# Patient Record
Sex: Female | Born: 1995 | Race: White | Hispanic: No | Marital: Married | Smoking: Never smoker
Health system: Southern US, Community
[De-identification: ages and names within clinical notes are randomized; demographics above are authoritative.]

## PROBLEM LIST (undated history)

## (undated) ENCOUNTER — Inpatient Hospital Stay (HOSPITAL_COMMUNITY): Payer: Self-pay

## (undated) DIAGNOSIS — B9689 Other specified bacterial agents as the cause of diseases classified elsewhere: Secondary | ICD-10-CM

## (undated) DIAGNOSIS — N760 Acute vaginitis: Secondary | ICD-10-CM

## (undated) DIAGNOSIS — Z789 Other specified health status: Secondary | ICD-10-CM

## (undated) HISTORY — PX: OTHER SURGICAL HISTORY: SHX169

## (undated) HISTORY — PX: NO PAST SURGERIES: SHX2092

## (undated) HISTORY — PX: WISDOM TOOTH EXTRACTION: SHX21

---

## 2017-01-07 ENCOUNTER — Inpatient Hospital Stay (HOSPITAL_COMMUNITY)
Admission: AD | Admit: 2017-01-07 | Discharge: 2017-01-07 | Disposition: A | Discharge status: Home / Self Care | Payer: 59 | Source: Ambulatory Visit | Attending: Obstetrics and Gynecology | Admitting: Obstetrics and Gynecology

## 2017-01-07 ENCOUNTER — Encounter (HOSPITAL_COMMUNITY): Payer: Self-pay | Admitting: *Deleted

## 2017-01-07 DIAGNOSIS — K2970 Gastritis, unspecified, without bleeding: Secondary | ICD-10-CM | POA: Insufficient documentation

## 2017-01-07 DIAGNOSIS — O218 Other vomiting complicating pregnancy: Secondary | ICD-10-CM | POA: Insufficient documentation

## 2017-01-07 DIAGNOSIS — Z3A01 Less than 8 weeks gestation of pregnancy: Secondary | ICD-10-CM | POA: Insufficient documentation

## 2017-01-07 DIAGNOSIS — Z3201 Encounter for pregnancy test, result positive: Secondary | ICD-10-CM | POA: Insufficient documentation

## 2017-01-07 DIAGNOSIS — R112 Nausea with vomiting, unspecified: Secondary | ICD-10-CM | POA: Diagnosis present

## 2017-01-07 LAB — URINALYSIS, ROUTINE W REFLEX MICROSCOPIC
Bilirubin Urine: NEGATIVE
GLUCOSE, UA: NEGATIVE mg/dL
Hgb urine dipstick: NEGATIVE
KETONES UR: 80 mg/dL — AB
LEUKOCYTES UA: NEGATIVE
NITRITE: NEGATIVE
PH: 5 (ref 5.0–8.0)
Protein, ur: NEGATIVE mg/dL
SPECIFIC GRAVITY, URINE: 1.029 (ref 1.005–1.030)

## 2017-01-07 LAB — POCT PREGNANCY, URINE: Preg Test, Ur: POSITIVE — AB

## 2017-01-07 MED ORDER — PROMETHAZINE HCL 25 MG/ML IJ SOLN
25.0000 mg | Freq: Once | INTRAMUSCULAR | Status: AC
  Administered 2017-01-07: 25 mg via INTRAVENOUS
  Filled 2017-01-07: qty 1

## 2017-01-07 MED ORDER — M.V.I. ADULT IV INJ
INJECTION | Freq: Once | INTRAVENOUS | Status: DC
  Filled 2017-01-07: qty 1000

## 2017-01-07 MED ORDER — PROMETHAZINE HCL 12.5 MG PO TABS: 12.5000 mg | ORAL_TABLET | Freq: Four times a day (QID) | ORAL | 0 refills | Status: DC | PRN

## 2017-01-07 MED ORDER — PROMETHAZINE HCL 12.5 MG RE SUPP: 12.5000 mg | Freq: Four times a day (QID) | RECTAL | 0 refills | Status: DC | PRN

## 2017-01-07 NOTE — MAU Note (Signed)
Pt presents to MAU with complaints of nausea and vomiting since Friday night. Denies any VB or abnormal discharge. Reports lower abdominal cramping that started this morning. Unable to keep anything down for 2 days

## 2017-01-07 NOTE — MAU Provider Note (Signed)
History     CSN: [id number]  Arrival date and time: 01/07/17 1249   First Provider Initiated Contact with Patient 01/07/17 1349      Chief Complaint  Patient presents with  . Emesis During Pregnancy   HPI  Ms. Melody Mcclain is a 21 yo G1P0 at 6.[redacted] wks gestation presenting to MAU with complaints of abdominal cramping and N/V since Friday.  She reports not being able to keep anything down since Friday.  She denies VB or abnl vaginal d/c.   History reviewed. No pertinent past medical history.  History reviewed. No pertinent surgical history.  No family history on file.  Social History  Substance Use Topics  . Smoking status: Never Smoker  . Smokeless tobacco: Never Used  . Alcohol use No    Allergies: No Known Allergies  Prescriptions Prior to Admission  Medication Sig Dispense Refill Last Dose  . acetaminophen (TYLENOL) 500 MG tablet Take 500 mg by mouth every 6 (six) hours as needed for mild pain.   Past Week at Unknown time  . ondansetron (ZOFRAN) 4 MG tablet Take 4 mg by mouth every 8 (eight) hours as needed for nausea or vomiting.   Past Week at Unknown time  . Prenatal Vit-Fe Fumarate-FA (PRENATAL MULTIVITAMIN) TABS tablet Take 1 tablet by mouth daily at 12 noon.   Past Week at Unknown time    Review of Systems  Constitutional: Positive for appetite change and fatigue.  HENT: Negative.   Eyes: Negative.   Respiratory: Negative.   Cardiovascular: Negative.   Gastrointestinal: Positive for abdominal pain, nausea and vomiting. Negative for diarrhea.  Genitourinary: Negative.   Musculoskeletal: Negative.   Skin: Negative.   Allergic/Immunologic: Negative.   Neurological: Negative.   Hematological: Negative.   Psychiatric/Behavioral: Negative.    Physical Exam   Blood pressure 125/75, pulse (!) 107, temperature 98.1 F (36.7 C), resp. rate 18, height 5\' 5"  (1.651 m), weight 56.7 kg (125 lb), last menstrual period 11/21/2016.  Physical Exam  Constitutional:  She is oriented to person, place, and time. She appears well-developed and well-nourished.  HENT:  Head: Normocephalic.  Eyes: Pupils are equal, round, and reactive to light.  Neck: Normal range of motion.  Cardiovascular: Normal rate, regular rhythm and normal heart sounds.   Respiratory: Effort normal and breath sounds normal.  GI: Soft. Bowel sounds are normal.  Musculoskeletal: Normal range of motion.  Neurological: She is alert and oriented to person, place, and time.  Skin: Skin is warm and dry.  Psychiatric: She has a normal mood and affect. Her behavior is normal. Judgment and thought content normal.    MAU Course  Procedures  MDM CCUA UPT C. Difficile PCR D5LR 1000 ml with Phenergan 25 mg bolus LR 1000 ml with 1 amp MVI @ 500 ml/hr  TC to Dr. Henderson CloudHorvath @ 1640 -- no answer, left message to return call *Consult with Dr. Henderson CloudHorvath @ [phone] - notified of patient's complaints, assessments, lab & U/S results, tx plan d/c home with Rx for Phenergan, call MD and pt with C. Diff results - ok to d/c home, agrees with plan   Results for orders placed or performed during the hospital encounter of 01/07/17 (from the past 24 hour(s))  Urinalysis, Routine w reflex microscopic     Status: Abnormal   Collection Time: 01/07/17 12:50 PM  Result Value Ref Range   Color, Urine YELLOW YELLOW   APPearance HAZY (A) CLEAR   Specific Gravity, Urine 1.029 1.005 - 1.030  pH 5.0 5.0 - 8.0   Glucose, UA NEGATIVE NEGATIVE mg/dL   Hgb urine dipstick NEGATIVE NEGATIVE   Bilirubin Urine NEGATIVE NEGATIVE   Ketones, ur 80 (A) NEGATIVE mg/dL   Protein, ur NEGATIVE NEGATIVE mg/dL   Nitrite NEGATIVE NEGATIVE   Leukocytes, UA NEGATIVE NEGATIVE  Pregnancy, urine POC     Status: Abnormal   Collection Time: 01/07/17  1:03 PM  Result Value Ref Range   Preg Test, Ur POSITIVE (A) NEGATIVE    Assessment and Plan  Gastritis without bleeding, unspecified chronicity, unspecified gastritis type - Rx Phenergan  12.5 mg po every 6 hrs - Bland diet instructions given -- advance as tolerated - Gastritis instructions given - Keep appt scheduled on 01/14/2017  Discharge home Patient verbalized an understanding of the plan of care and agrees.   Raelyn Mora, MSN, CNM 01/07/2017, 1:52 PM

## 2017-01-08 ENCOUNTER — Telehealth: Payer: Self-pay | Admitting: Obstetrics and Gynecology

## 2017-01-08 ENCOUNTER — Encounter: Payer: Self-pay | Admitting: Obstetrics and Gynecology

## 2017-01-08 LAB — C DIFFICILE QUICK SCREEN W PCR REFLEX
C DIFFICILE (CDIFF) INTERP: NOT DETECTED
C DIFFICILE (CDIFF) TOXIN: NEGATIVE
C DIFFICLE (CDIFF) ANTIGEN: NEGATIVE

## 2017-01-08 NOTE — Telephone Encounter (Signed)
TC to discuss results of C. Difficile test.  Notified that results are negative. According to the advisement of Dr. Henderson CloudHorvath, she can take Immodium AD as directed on the bottle.  Recommend continuing B.R.A.T.diet as tolerated and advance diet slowly.  Recommend keeping hard surfaces cleaned off with bleach and practice good hand washing. Offered letter for work, but patient declined.  All questions and concerns addressed.  Patient verbalized an understanding of the plan of care and agrees.   Raelyn Moraolitta Chanae Gemma, CNM 01/08/2017, 8:14 AM

## 2017-02-05 LAB — OB RESULTS CONSOLE ABO/RH: RH TYPE: POSITIVE

## 2017-02-05 LAB — OB RESULTS CONSOLE HIV ANTIBODY (ROUTINE TESTING): HIV: NONREACTIVE

## 2017-02-05 LAB — OB RESULTS CONSOLE GC/CHLAMYDIA
Chlamydia: NEGATIVE
Gonorrhea: NEGATIVE

## 2017-02-05 LAB — OB RESULTS CONSOLE HEPATITIS B SURFACE ANTIGEN: HEP B S AG: NEGATIVE

## 2017-02-05 LAB — OB RESULTS CONSOLE RUBELLA ANTIBODY, IGM: Rubella: IMMUNE

## 2017-02-05 LAB — OB RESULTS CONSOLE RPR: RPR: NONREACTIVE

## 2017-02-05 LAB — OB RESULTS CONSOLE ANTIBODY SCREEN: ANTIBODY SCREEN: NEGATIVE

## 2017-05-12 ENCOUNTER — Inpatient Hospital Stay (HOSPITAL_COMMUNITY)
Admission: AD | Admit: 2017-05-12 | Discharge: 2017-05-12 | Disposition: A | Discharge status: Home / Self Care | Payer: 59 | Source: Ambulatory Visit | Attending: Obstetrics and Gynecology | Admitting: Obstetrics and Gynecology

## 2017-05-12 ENCOUNTER — Encounter (HOSPITAL_COMMUNITY): Payer: Self-pay

## 2017-05-12 DIAGNOSIS — B9689 Other specified bacterial agents as the cause of diseases classified elsewhere: Secondary | ICD-10-CM

## 2017-05-12 DIAGNOSIS — Z3A24 24 weeks gestation of pregnancy: Secondary | ICD-10-CM | POA: Diagnosis not present

## 2017-05-12 DIAGNOSIS — O23592 Infection of other part of genital tract in pregnancy, second trimester: Secondary | ICD-10-CM | POA: Insufficient documentation

## 2017-05-12 DIAGNOSIS — O9989 Other specified diseases and conditions complicating pregnancy, childbirth and the puerperium: Secondary | ICD-10-CM

## 2017-05-12 DIAGNOSIS — N898 Other specified noninflammatory disorders of vagina: Secondary | ICD-10-CM

## 2017-05-12 DIAGNOSIS — O26892 Other specified pregnancy related conditions, second trimester: Secondary | ICD-10-CM | POA: Diagnosis present

## 2017-05-12 DIAGNOSIS — N760 Acute vaginitis: Secondary | ICD-10-CM | POA: Diagnosis not present

## 2017-05-12 HISTORY — DX: Acute vaginitis: N76.0

## 2017-05-12 HISTORY — DX: Other specified bacterial agents as the cause of diseases classified elsewhere: B96.89

## 2017-05-12 LAB — POCT FERN TEST: POCT FERN TEST: NEGATIVE

## 2017-05-12 LAB — WET PREP, GENITAL
Trich, Wet Prep: NONE SEEN
Yeast Wet Prep HPF POC: NONE SEEN

## 2017-05-12 LAB — URINALYSIS, ROUTINE W REFLEX MICROSCOPIC
Bilirubin Urine: NEGATIVE
GLUCOSE, UA: NEGATIVE mg/dL
Hgb urine dipstick: NEGATIVE
KETONES UR: 5 mg/dL — AB
LEUKOCYTES UA: NEGATIVE
Nitrite: NEGATIVE
PH: 6 (ref 5.0–8.0)
Protein, ur: NEGATIVE mg/dL
Specific Gravity, Urine: 1.012 (ref 1.005–1.030)

## 2017-05-12 MED ORDER — METRONIDAZOLE 500 MG PO TABS: 500.0000 mg | ORAL_TABLET | Freq: Two times a day (BID) | ORAL | 0 refills | Status: AC

## 2017-05-12 NOTE — MAU Note (Signed)
Pt has been leaking fluid for a week. Called the office today and was told to come in. Small amount of fluid, no bleeding. + FM

## 2017-05-12 NOTE — MAU Provider Note (Signed)
History     CSN: [id number]  Arrival date and time: 05/12/17 1747   First Provider Initiated Contact with Patient 05/12/17 1826      Chief Complaint  Patient presents with  . Rupture of Membranes   HPI  Melody Mcclain is a 21 y.o. G1P0 at 6441w4d gestation presenting to MAU with complaints of "leaking fluid" x 1 wk. She talked to a friend who had premature rupture of membranes and "something was wrong with the baby", so she is "worried something could be wrong". She has been wearing pantiliners for a week and they have been wet at the end of the day, but was "more wet" this morning.    Past Medical History:  Diagnosis Date  . BV (bacterial vaginosis)     Past Surgical History:  Procedure Laterality Date  . WISDOM TOOTH EXTRACTION      History reviewed. No pertinent family history.  Social History   Tobacco Use  . Smoking status: Never Smoker  . Smokeless tobacco: Never Used  Substance Use Topics  . Alcohol use: No  . Drug use: No    Allergies: No Known Allergies  Medications Prior to Admission  Medication Sig Dispense Refill Last Dose  . acetaminophen (TYLENOL) 500 MG tablet Take 500 mg by mouth every 6 (six) hours as needed for mild pain.   Past Week at Unknown time  . ondansetron (ZOFRAN) 4 MG tablet Take 4 mg by mouth every 8 (eight) hours as needed for nausea or vomiting.   Past Week at Unknown time  . Prenatal Vit-Fe Fumarate-FA (PRENATAL MULTIVITAMIN) TABS tablet Take 1 tablet by mouth daily at 12 noon.   Past Week at Unknown time  . promethazine (PHENERGAN) 12.5 MG tablet Take 1 tablet (12.5 mg total) by mouth every 6 (six) hours as needed for nausea or vomiting. 30 tablet 0     Review of Systems  Constitutional: Negative.   HENT: Negative.   Eyes: Negative.   Respiratory: Negative.   Cardiovascular: Negative.   Gastrointestinal: Negative.   Endocrine: Negative.   Genitourinary: Positive for vaginal discharge (unsure if leaking x 1 week).   Musculoskeletal: Negative.   Skin: Negative.   Allergic/Immunologic: Negative.   Neurological: Negative.   Hematological: Negative.   Psychiatric/Behavioral: Negative.    Physical Exam   Temperature 98.4 F (36.9 C), temperature source Oral, resp. rate 16, last menstrual period 11/21/2016, SpO2 99 %.  Physical Exam  Nursing note and vitals reviewed. Constitutional: She is oriented to person, place, and time. She appears well-nourished.  HENT:  Head: Normocephalic.  Eyes: Pupils are equal, round, and reactive to light.  Neck: Normal range of motion.  Cardiovascular: Normal rate, regular rhythm and normal heart sounds.  Respiratory: Effort normal and breath sounds normal.  GI: Soft. Bowel sounds are normal.  Genitourinary:  Genitourinary Comments: Uterus: gravid, S=D, cx: smooth, pink, no lesions, moderate amt of clear, watery, mucoid d/c coming from cervical os, closed/long/firm, no CMT or friability, no adnexal tenderness   Musculoskeletal: Normal range of motion.  Neurological: She is alert and oriented to person, place, and time.  Skin: Skin is warm and dry.  Psychiatric: She has a normal mood and affect. Her behavior is normal. Judgment and thought content normal.    MAU Course  Procedures  MDM CCUA Wet Prep Fern NST - FHR: 130 bpm / moderate variability / accels present / decels absent / TOCO: none  *Consult with Dr. Claiborne Billingsallahan @ 2000 - notified of patient's  complaints, assessments, lab & U/S results, tx plan Rx for Flagyl and keep scheduled appt - ok to d/c home, agrees with plan  Results for orders placed or performed during the hospital encounter of 05/12/17 (from the past 24 hour(s))  Wet prep, genital     Status: Abnormal   Collection Time: 05/12/17  6:30 PM  Result Value Ref Range   Yeast Wet Prep HPF POC NONE SEEN NONE SEEN   Trich, Wet Prep NONE SEEN NONE SEEN   Clue Cells Wet Prep HPF POC PRESENT (A) NONE SEEN   WBC, Wet Prep HPF POC MODERATE (A) NONE SEEN    Sperm PRESENT   Fern Test     Status: None   Collection Time: 05/12/17  6:53 PM  Result Value Ref Range   POCT Fern Test Negative = intact amniotic membranes   Urinalysis, Routine w reflex microscopic     Status: Abnormal   Collection Time: 05/12/17  6:58 PM  Result Value Ref Range   Color, Urine YELLOW YELLOW   APPearance CLEAR CLEAR   Specific Gravity, Urine 1.012 1.005 - 1.030   pH 6.0 5.0 - 8.0   Glucose, UA NEGATIVE NEGATIVE mg/dL   Hgb urine dipstick NEGATIVE NEGATIVE   Bilirubin Urine NEGATIVE NEGATIVE   Ketones, ur 5 (A) NEGATIVE mg/dL   Protein, ur NEGATIVE NEGATIVE mg/dL   Nitrite NEGATIVE NEGATIVE   Leukocytes, UA NEGATIVE NEGATIVE    Assessment and Plan  Bacterial vaginitis - Rx for Flagyl 500 mg po BID x 7 days sent - Information provided on BV and Flagyl Discharge home Keep scheduled appt with [facility] on 06/02/2017 Patient verbalized an understanding of the plan of care and agrees.    Melody Moraolitta Jerika Wales, MSN, CNM 05/12/2017, 6:38 PM

## 2017-05-16 ENCOUNTER — Encounter (HOSPITAL_COMMUNITY): Payer: Self-pay

## 2017-05-16 ENCOUNTER — Inpatient Hospital Stay (HOSPITAL_COMMUNITY)
Admission: AD | Admit: 2017-05-16 | Discharge: 2017-05-16 | Disposition: A | Discharge status: Home / Self Care | Payer: 59 | Source: Ambulatory Visit | Attending: Obstetrics | Admitting: Obstetrics

## 2017-05-16 DIAGNOSIS — R102 Pelvic and perineal pain: Secondary | ICD-10-CM | POA: Insufficient documentation

## 2017-05-16 DIAGNOSIS — Z3A25 25 weeks gestation of pregnancy: Secondary | ICD-10-CM | POA: Insufficient documentation

## 2017-05-16 DIAGNOSIS — R109 Unspecified abdominal pain: Secondary | ICD-10-CM | POA: Insufficient documentation

## 2017-05-16 DIAGNOSIS — O26892 Other specified pregnancy related conditions, second trimester: Secondary | ICD-10-CM | POA: Diagnosis not present

## 2017-05-16 LAB — URINALYSIS, ROUTINE W REFLEX MICROSCOPIC
BILIRUBIN URINE: NEGATIVE
Glucose, UA: NEGATIVE mg/dL
HGB URINE DIPSTICK: NEGATIVE
KETONES UR: NEGATIVE mg/dL
NITRITE: NEGATIVE
PH: 7 (ref 5.0–8.0)
Protein, ur: NEGATIVE mg/dL
SPECIFIC GRAVITY, URINE: 1.015 (ref 1.005–1.030)
Squamous Epithelial / LPF: NONE SEEN

## 2017-05-16 NOTE — Discharge Instructions (Signed)

## 2017-05-16 NOTE — MAU Provider Note (Signed)
Chief Complaint:  Abdominal Pain and Pelvic Pain   First Provider Initiated Contact with Patient 05/16/17 1643     HPI  HPI: Melody Mcclain is a 21 y.o. G1P0 at 1125w1dwho presents to maternity admissions reporting pelvic pain & vaginal bleeding. Reports intermittent sharp pains in vagina & constant pelvic pressure that started last Wednesday when she was seen in MAU. Denies n/v/d, dysuria, vaginal bleeding, vaginal discharge, or recent intercourse. Rates pain 4/10. Has not treated. Positive fetal movement.  Past Medical History: Past Medical History:  Diagnosis Date  . BV (bacterial vaginosis)     Past obstetric history: OB History  Gravida Para Term Preterm AB Living  1            SAB TAB Ectopic Multiple Live Births               # Outcome Date GA Lbr Len/2nd Weight Sex Delivery Anes PTL Lv  1 Current               Past Surgical History: Past Surgical History:  Procedure Laterality Date  . WISDOM TOOTH EXTRACTION      Family History: History reviewed. No pertinent family history.  Social History: Social History   Tobacco Use  . Smoking status: Never Smoker  . Smokeless tobacco: Never Used  Substance Use Topics  . Alcohol use: No  . Drug use: No    Allergies: No Known Allergies  Meds:  No medications prior to admission.    I have reviewed patient's Past Medical Hx, Surgical Hx, Family Hx, Social Hx, medications and allergies.   ROS:  Review of Systems  Gastrointestinal: Positive for abdominal pain. Negative for constipation, diarrhea, nausea and vomiting.  Genitourinary: Positive for pelvic pain. Negative for vaginal bleeding and vaginal discharge.   Other systems negative  Physical Exam   Patient Vitals for the past 24 hrs:  BP Temp Temp src Pulse Resp SpO2 Height Weight  05/16/17 1740 96/61 - - - - - - -  05/16/17 1619 122/67 98.2 F (36.8 C) Oral (!) 101 18 99 % 5\' 4"  (1.626 m) 135 lb (61.2 kg)   Constitutional: Well-developed, well-nourished  female in no acute distress.  Cardiovascular: normal rate and rhythm Respiratory: normal effort, clear to auscultation bilaterally GI: Abd soft, non-tender, gravid appropriate for gestational age.   No rebound or guarding. MS: Extremities nontender, no edema, normal ROM Neurologic: Alert and oriented x 4.  GU: Neg CVAT.  Dilation: Closed Effacement (%): Thick Cervical Position: Posterior Exam by:: e Amyriah Buras np Dilation: Closed Effacement (%): Thick Cervical Position: Posterior Exam by:: e Kumari Sculley np  FHT:  Baseline 150 , moderate variability, accelerations present, no decelerations Contractions: none   Labs: Results for orders placed or performed during the hospital encounter of 05/16/17 (from the past 24 hour(s))  Urinalysis, Routine w reflex microscopic     Status: Abnormal   Collection Time: 05/16/17  4:20 PM  Result Value Ref Range   Color, Urine YELLOW YELLOW   APPearance CLOUDY (A) CLEAR   Specific Gravity, Urine 1.015 1.005 - 1.030   pH 7.0 5.0 - 8.0   Glucose, UA NEGATIVE NEGATIVE mg/dL   Hgb urine dipstick NEGATIVE NEGATIVE   Bilirubin Urine NEGATIVE NEGATIVE   Ketones, ur NEGATIVE NEGATIVE mg/dL   Protein, ur NEGATIVE NEGATIVE mg/dL   Nitrite NEGATIVE NEGATIVE   Leukocytes, UA TRACE (A) NEGATIVE   RBC / HPF 0-5 0 - 5 RBC/hpf   WBC, UA 0-5 0 - 5  WBC/hpf   Bacteria, UA RARE (A) NONE SEEN   Squamous Epithelial / LPF NONE SEEN NONE SEEN      Imaging:  No results found.  MAU Course/MDM: Fetal tracing appropriate for gestation No ctx on toco or palpated Cervix closed/thick/firm/posterior Trace leuks on u/a; asymptomatic -- will send for urine culture Patient reassured by exam.  C/w Dr. Chestine Sporelark. Ok to discharge home  Assessment: 1. Abdominal pain during pregnancy in second trimester   2. [redacted] weeks gestation of pregnancy     Plan: Discharge home Preterm Labor precautions Follow up in Office for prenatal visits and recheck Follow-up Information     [facility]   Contact information: [address] [place] [id number] [phone]           Pt stable at time of discharge.  Melody HornErin Britney Newstrom, FNP 05/16/2017 9:00 PM

## 2017-05-16 NOTE — MAU Note (Signed)
Pt reports vaginal pressure and sharp pain. Reports she is on meds for BV.

## 2017-05-18 LAB — CULTURE, OB URINE: CULTURE: NO GROWTH

## 2017-06-01 NOTE — L&D Delivery Note (Addendum)
Pt arrived to MAU for labor eval and had only lip of cervix present.  I was called to MAU and advised that no bed was available on L&D and no staffing available to assist with delivery.  Patient was C/C/+1 and pushed for approx 20 minutes without epidural.    NSVD  maleinfant, Apgars 9/9, weight pending.   The patient had tiny first degree perineal laceration repaired with 3-0 vicryl in figure of eight and bilateral labial tears repaired with 3-0 vicryl. Fundus was firm. EBL was expected amount. Placenta was delivered intact. Vagina was clear.  Delayed cord clamping done for 30-60 seconds while warming baby. Baby was vigorous and doing skin to skin with mother.  Melody Mcclain, Melody Mcclain

## 2017-07-30 LAB — OB RESULTS CONSOLE GBS: STREP GROUP B AG: NEGATIVE

## 2017-08-08 ENCOUNTER — Inpatient Hospital Stay (HOSPITAL_COMMUNITY)
Admission: AD | Admit: 2017-08-08 | Discharge: 2017-08-08 | Disposition: A | Discharge status: Home / Self Care | Payer: 59 | Source: Ambulatory Visit | Attending: Obstetrics and Gynecology | Admitting: Obstetrics and Gynecology

## 2017-08-08 ENCOUNTER — Encounter (HOSPITAL_COMMUNITY): Payer: Self-pay

## 2017-08-08 ENCOUNTER — Other Ambulatory Visit: Payer: Self-pay

## 2017-08-08 DIAGNOSIS — O4703 False labor before 37 completed weeks of gestation, third trimester: Secondary | ICD-10-CM

## 2017-08-08 DIAGNOSIS — Z3483 Encounter for supervision of other normal pregnancy, third trimester: Secondary | ICD-10-CM | POA: Diagnosis present

## 2017-08-08 DIAGNOSIS — Z3A37 37 weeks gestation of pregnancy: Secondary | ICD-10-CM | POA: Diagnosis not present

## 2017-08-08 DIAGNOSIS — O26893 Other specified pregnancy related conditions, third trimester: Secondary | ICD-10-CM

## 2017-08-08 DIAGNOSIS — N898 Other specified noninflammatory disorders of vagina: Secondary | ICD-10-CM

## 2017-08-08 DIAGNOSIS — O479 False labor, unspecified: Secondary | ICD-10-CM

## 2017-08-08 LAB — POCT FERN TEST: POCT FERN TEST: NEGATIVE

## 2017-08-08 LAB — AMNISURE RUPTURE OF MEMBRANE (ROM) NOT AT ARMC: AMNISURE: NEGATIVE

## 2017-08-08 NOTE — Discharge Instructions (Signed)
Braxton Hicks Contractions °Contractions of the uterus can occur throughout pregnancy, but they are not always a sign that you are in labor. You may have practice contractions called Braxton Hicks contractions. These false labor contractions are sometimes confused with true labor. °What are Braxton Hicks contractions? °Braxton Hicks contractions are tightening movements that occur in the muscles of the uterus before labor. Unlike true labor contractions, these contractions do not result in opening (dilation) and thinning of the cervix. Toward the end of pregnancy (32-34 weeks), Braxton Hicks contractions can happen more often and may become stronger. These contractions are sometimes difficult to tell apart from true labor because they can be very uncomfortable. You should not feel embarrassed if you go to the hospital with false labor. °Sometimes, the only way to tell if you are in true labor is for your health care provider to look for changes in the cervix. The health care provider will do a physical exam and may monitor your contractions. If you are not in true labor, the exam should show that your cervix is not dilating and your water has not broken. °If there are other health problems associated with your pregnancy, it is completely safe for you to be sent home with false labor. You may continue to have Braxton Hicks contractions until you go into true labor. °How to tell the difference between true labor and false labor °True labor °· Contractions last 30-70 seconds. °· Contractions become very regular. °· Discomfort is usually felt in the top of the uterus, and it spreads to the lower abdomen and low back. °· Contractions do not go away with walking. °· Contractions usually become more intense and increase in frequency. °· The cervix dilates and gets thinner. °False labor °· Contractions are usually shorter and not as strong as true labor contractions. °· Contractions are usually irregular. °· Contractions  are often felt in the front of the lower abdomen and in the groin. °· Contractions may go away when you walk around or change positions while lying down. °· Contractions get weaker and are shorter-lasting as time goes on. °· The cervix usually does not dilate or become thin. °Follow these instructions at home: °· Take over-the-counter and prescription medicines only as told by your health care provider. °· Keep up with your usual exercises and follow other instructions from your health care provider. °· Eat and drink lightly if you think you are going into labor. °· If Braxton Hicks contractions are making you uncomfortable: °? Change your position from lying down or resting to walking, or change from walking to resting. °? Sit and rest in a tub of warm water. °? Drink enough fluid to keep your urine pale yellow. Dehydration may cause these contractions. °? Do slow and deep breathing several times an hour. °· Keep all follow-up prenatal visits as told by your health care provider. This is important. °Contact a health care provider if: °· You have a fever. °· You have continuous pain in your abdomen. °Get help right away if: °· Your contractions become stronger, more regular, and closer together. °· You have fluid leaking or gushing from your vagina. °· You pass blood-tinged mucus (bloody show). °· You have bleeding from your vagina. °· You have low back pain that you never had before. °· You feel your baby’s head pushing down and causing pelvic pressure. °· Your baby is not moving inside you as much as it used to. °Summary °· Contractions that occur before labor are called Braxton   Hicks contractions, false labor, or practice contractions. °· Braxton Hicks contractions are usually shorter, weaker, farther apart, and less regular than true labor contractions. True labor contractions usually become progressively stronger and regular and they become more frequent. °· Manage discomfort from Braxton Hicks contractions by  changing position, resting in a warm bath, drinking plenty of water, or practicing deep breathing. °This information is not intended to replace advice given to you by your health care provider. Make sure you discuss any questions you have with your health care provider. °Document Released: 10/01/2016 Document Revised: 10/01/2016 Document Reviewed: 10/01/2016 °Elsevier Interactive Patient Education © 2018 [employer] ° °Fetal Movement Counts °Patient Name: ________________________________________________ Patient Due Date: ____________________ °What is a fetal movement count? °A fetal movement count is the number of times that you feel your baby move during a certain amount of time. This may also be called a fetal kick count. A fetal movement count is recommended for every pregnant woman. You may be asked to start counting fetal movements as early as week 28 of your pregnancy. °Pay attention to when your baby is most active. You may notice your baby's sleep and wake cycles. You may also notice things that make your baby move more. You should do a fetal movement count: °· When your baby is normally most active. °· At the same time each day. ° °A good time to count movements is while you are resting, after having something to eat and drink. °How do I count fetal movements? °1. Find a quiet, comfortable area. Sit, or lie down on your side. °2. Write down the date, the start time and stop time, and the number of movements that you felt between those two times. Take this information with you to your health care visits. °3. For 2 hours, count kicks, flutters, swishes, rolls, and jabs. You should feel at least 10 movements during 2 hours. °4. You may stop counting after you have felt 10 movements. °5. If you do not feel 10 movements in 2 hours, have something to eat and drink. Then, keep resting and counting for 1 hour. If you feel at least 4 movements during that hour, you may stop counting. °Contact a health care  provider if: °· You feel fewer than 4 movements in 2 hours. °· Your baby is not moving like he or she usually does. °Date: ____________ Start time: ____________ Stop time: ____________ Movements: ____________ °Date: ____________ Start time: ____________ Stop time: ____________ Movements: ____________ °Date: ____________ Start time: ____________ Stop time: ____________ Movements: ____________ °Date: ____________ Start time: ____________ Stop time: ____________ Movements: ____________ °Date: ____________ Start time: ____________ Stop time: ____________ Movements: ____________ °Date: ____________ Start time: ____________ Stop time: ____________ Movements: ____________ °Date: ____________ Start time: ____________ Stop time: ____________ Movements: ____________ °Date: ____________ Start time: ____________ Stop time: ____________ Movements: ____________ °Date: ____________ Start time: ____________ Stop time: ____________ Movements: ____________ °This information is not intended to replace advice given to you by your health care provider. Make sure you discuss any questions you have with your health care provider. °Document Released: 06/17/2006 Document Revised: 01/15/2016 Document Reviewed: 06/27/2015 °Elsevier Interactive Patient Education © 2018 [employer] ° °

## 2017-08-08 NOTE — MAU Provider Note (Signed)
S: Ms. Melody Mcclain is a 22 y.o. G1P0 at 4864w1d  who presents to MAU today complaining of leaking of fluid one time this AM and then "brown floaters" in the toilet. She denies vaginal bleeding. She denies contractions. She reports normal fetal movement.    O: BP 113/70   Pulse 87   Resp 18   Ht 5\' 4"  (1.626 m)   Wt 153 lb (69.4 kg)   LMP 11/21/2016   SpO2 98%   BMI 26.26 kg/m  GENERAL: Well-developed, well-nourished female in no acute distress.  HEAD: Normocephalic, atraumatic.  CHEST: Normal effort of breathing, regular heart rate ABDOMEN: Soft, nontender, gravid PELVIC: Normal external female genitalia. Vagina is pink and rugated. Cervix with normal contour, no lesions. Normal discharge.  Small amt pooling of thin vaginal d/c.   Cervical exam:  Dilation: 1.5 Effacement (%): 40 Cervical Position: Posterior Station: Ballotable Presentation: Vertex Exam by:: Melody ArcherJ McClellan, RN  Fetal Monitoring: Baseline: 135 Variability: moderate Accelerations: present Decelerations: none Contractions: Occ UC's and UI noted  Results for orders placed or performed during the hospital encounter of 08/08/17 (from the past 24 hour(s))  Fern Test     Status: Normal   Collection Time: 08/08/17  5:19 PM  Result Value Ref Range   POCT Fern Test Negative = intact amniotic membranes   Am[facility]isure rupture of membrane (rom)[facility]     Status: None   Collection Time: 08/08/17  5:55 PM  Result Value Ref Range   Amnisure ROM NEGATIVE     A: SIUP at 2564w1d  Membranes intact  P: Report given to RN to contact MD on call for further instructions  Raelyn MoraRolitta Cynithia Hakimi, CNM 08/09/2017, 6:00 PM

## 2017-08-08 NOTE — Progress Notes (Addendum)
G1 @ 37.[redacted] wksga. Here dt possible ROM. Denies bleeding. EFM applied. +FM. VSS see flow sheet for details   SVE 1.5/thick/ballotable. No fluid noted on glove.   Fern collected. negative  1752: Provider at bs for speculum exam for pooling   amnisure done and negative result  1839: Dr. Henderson CloudHorvath notified. Report status of pt given. Orders received to have MAU provider put in D/C order.   D/c instructions given with pt understanding. Pt left unit via ambulatory with SO

## 2017-08-08 NOTE — MAU Note (Signed)
Urine sent to lab 

## 2017-08-24 ENCOUNTER — Encounter (HOSPITAL_COMMUNITY): Payer: Self-pay | Admitting: *Deleted

## 2017-08-24 ENCOUNTER — Other Ambulatory Visit: Payer: Self-pay | Admitting: Obstetrics and Gynecology

## 2017-08-24 ENCOUNTER — Telehealth (HOSPITAL_COMMUNITY): Payer: Self-pay | Admitting: *Deleted

## 2017-08-24 NOTE — Telephone Encounter (Signed)
Preadmission screen  

## 2017-08-25 ENCOUNTER — Inpatient Hospital Stay (HOSPITAL_COMMUNITY)
Admission: AD | Admit: 2017-08-25 | Discharge: 2017-08-26 | DRG: 807 | Disposition: A | Discharge status: Home / Self Care | Payer: 59 | Source: Ambulatory Visit | Attending: Obstetrics and Gynecology | Admitting: Obstetrics and Gynecology

## 2017-08-25 ENCOUNTER — Encounter (HOSPITAL_COMMUNITY): Payer: Self-pay

## 2017-08-25 ENCOUNTER — Inpatient Hospital Stay (HOSPITAL_COMMUNITY)
Admission: AD | Admit: 2017-08-25 | Discharge: 2017-08-25 | Disposition: A | Discharge status: Home / Self Care | Payer: 59 | Source: Ambulatory Visit | Attending: Obstetrics and Gynecology | Admitting: Obstetrics and Gynecology

## 2017-08-25 DIAGNOSIS — Z3483 Encounter for supervision of other normal pregnancy, third trimester: Secondary | ICD-10-CM | POA: Diagnosis present

## 2017-08-25 DIAGNOSIS — O479 False labor, unspecified: Secondary | ICD-10-CM

## 2017-08-25 DIAGNOSIS — Z3A39 39 weeks gestation of pregnancy: Secondary | ICD-10-CM

## 2017-08-25 HISTORY — DX: Other specified health status: Z78.9

## 2017-08-25 LAB — CBC
HCT: 35.6 % — ABNORMAL LOW (ref 36.0–46.0)
Hemoglobin: 12.1 g/dL (ref 12.0–15.0)
MCH: 30.1 pg (ref 26.0–34.0)
MCHC: 34 g/dL (ref 30.0–36.0)
MCV: 88.6 fL (ref 78.0–100.0)
PLATELETS: 177 10*3/uL (ref 150–400)
RBC: 4.02 MIL/uL (ref 3.87–5.11)
RDW: 13.5 % (ref 11.5–15.5)
WBC: 24.4 10*3/uL — AB (ref 4.0–10.5)

## 2017-08-25 LAB — ABO/RH: ABO/RH(D): A POS

## 2017-08-25 LAB — TYPE AND SCREEN
ABO/RH(D): A POS
Antibody Screen: NEGATIVE

## 2017-08-25 MED ORDER — FLEET ENEMA 7-19 GM/118ML RE ENEM: 1.0000 | ENEMA | RECTAL | Status: DC | PRN

## 2017-08-25 MED ORDER — SENNOSIDES-DOCUSATE SODIUM 8.6-50 MG PO TABS
2.0000 | ORAL_TABLET | ORAL | Status: DC
  Administered 2017-08-26: 2 via ORAL
  Filled 2017-08-25: qty 2

## 2017-08-25 MED ORDER — WITCH HAZEL-GLYCERIN EX PADS
1.0000 "application " | MEDICATED_PAD | CUTANEOUS | Status: DC | PRN
  Administered 2017-08-26: 1 via TOPICAL

## 2017-08-25 MED ORDER — TETANUS-DIPHTH-ACELL PERTUSSIS 5-2.5-18.5 LF-MCG/0.5 IM SUSP: 0.5000 mL | Freq: Once | INTRAMUSCULAR | Status: DC

## 2017-08-25 MED ORDER — ACETAMINOPHEN 325 MG PO TABS: 650.0000 mg | ORAL_TABLET | ORAL | Status: DC | PRN

## 2017-08-25 MED ORDER — OXYCODONE-ACETAMINOPHEN 5-325 MG PO TABS: 1.0000 | ORAL_TABLET | ORAL | Status: DC | PRN

## 2017-08-25 MED ORDER — LACTATED RINGERS IV SOLN
INTRAVENOUS | Status: DC
  Administered 2017-08-25: 10:00:00 via INTRAVENOUS

## 2017-08-25 MED ORDER — LACTATED RINGERS IV SOLN: 500.0000 mL | INTRAVENOUS | Status: DC | PRN

## 2017-08-25 MED ORDER — ZOLPIDEM TARTRATE 5 MG PO TABS: 5.0000 mg | ORAL_TABLET | Freq: Every evening | ORAL | Status: DC | PRN

## 2017-08-25 MED ORDER — OXYTOCIN BOLUS FROM INFUSION
500.0000 mL | Freq: Once | INTRAVENOUS | Status: AC
  Administered 2017-08-25: 500 mL via INTRAVENOUS

## 2017-08-25 MED ORDER — DIPHENHYDRAMINE HCL 25 MG PO CAPS: 25.0000 mg | ORAL_CAPSULE | Freq: Four times a day (QID) | ORAL | Status: DC | PRN

## 2017-08-25 MED ORDER — IBUPROFEN 600 MG PO TABS
600.0000 mg | ORAL_TABLET | Freq: Four times a day (QID) | ORAL | Status: DC
  Administered 2017-08-25 – 2017-08-26 (×4): 600 mg via ORAL
  Filled 2017-08-25 (×4): qty 1

## 2017-08-25 MED ORDER — SIMETHICONE 80 MG PO CHEW: 80.0000 mg | CHEWABLE_TABLET | ORAL | Status: DC | PRN

## 2017-08-25 MED ORDER — SOD CITRATE-CITRIC ACID 500-334 MG/5ML PO SOLN: 30.0000 mL | ORAL | Status: DC | PRN

## 2017-08-25 MED ORDER — COCONUT OIL OIL: 1.0000 "application " | TOPICAL_OIL | Status: DC | PRN

## 2017-08-25 MED ORDER — DIBUCAINE 1 % RE OINT: 1.0000 "application " | TOPICAL_OINTMENT | RECTAL | Status: DC | PRN

## 2017-08-25 MED ORDER — OXYTOCIN 40 UNITS IN LACTATED RINGERS INFUSION - SIMPLE MED
INTRAVENOUS | Status: AC
  Administered 2017-08-25: 500 mL via INTRAVENOUS
  Filled 2017-08-25: qty 1000

## 2017-08-25 MED ORDER — PRENATAL MULTIVITAMIN CH
1.0000 | ORAL_TABLET | Freq: Every day | ORAL | Status: DC
  Administered 2017-08-26: 1 via ORAL
  Filled 2017-08-25: qty 1

## 2017-08-25 MED ORDER — OXYCODONE-ACETAMINOPHEN 5-325 MG PO TABS: 2.0000 | ORAL_TABLET | ORAL | Status: DC | PRN

## 2017-08-25 MED ORDER — ONDANSETRON HCL 4 MG/2ML IJ SOLN: 4.0000 mg | Freq: Four times a day (QID) | INTRAMUSCULAR | Status: DC | PRN

## 2017-08-25 MED ORDER — ONDANSETRON HCL 4 MG PO TABS: 4.0000 mg | ORAL_TABLET | ORAL | Status: DC | PRN

## 2017-08-25 MED ORDER — OXYTOCIN 40 UNITS IN LACTATED RINGERS INFUSION - SIMPLE MED: 2.5000 [IU]/h | INTRAVENOUS | Status: DC

## 2017-08-25 MED ORDER — LIDOCAINE HCL (PF) 1 % IJ SOLN
30.0000 mL | INTRAMUSCULAR | Status: DC | PRN
  Filled 2017-08-25: qty 30

## 2017-08-25 MED ORDER — ONDANSETRON HCL 4 MG/2ML IJ SOLN: 4.0000 mg | INTRAMUSCULAR | Status: DC | PRN

## 2017-08-25 MED ORDER — BENZOCAINE-MENTHOL 20-0.5 % EX AERO
1.0000 "application " | INHALATION_SPRAY | CUTANEOUS | Status: DC | PRN
  Administered 2017-08-25: 1 via TOPICAL
  Filled 2017-08-25: qty 56

## 2017-08-25 NOTE — Lactation Note (Signed)
This note was copied from a baby's chart. Lactation Consultation Note  Patient Name: Melody Mcclain [id number]'XToday's Date: 08/25/2017 Reason for consult: Initial assessment;1st time breastfeeding;Primapara;Term  3613 hours old female who is being exclusively BF by his mother, she's a P1. Per mom BF is going well, she's been able to latch baby on without any difficulties since birth; per mom her RN has observed the feeding but there's no LATCH documentation on chart yet. Offered assistance with latch but mom politely declined saying her baby just fed an hour ago.  Per mom feedings at the breast are comfortable, both nipples looked intact upon examination. Taught mom how to hand express, she was able to get 2 ml of colostrum, reviewed breastmilk storage, parents will be feeding that to baby on next feeding.  Encouraged mom to feed baby on cues STS 8-12 times/24 hours. Discussed cluster feeding. Reviewed BF brochure, BF resources and feeding diary, parents are aware of LC services and will call PRN.  Maternal Data Formula Feeding for Exclusion: No Has patient been taught Hand Expression?: Yes Does the patient have breastfeeding experience prior to this delivery?: No  Feeding Feeding Type: Breast Fed    Interventions Interventions: Breast feeding basics reviewed;Breast massage;Breast compression;Expressed milk;Hand express  Lactation Tools Discussed/Used WIC Program: No   Consult Status Consult Status: Follow-up Date: 08/26/17 Follow-up type: In-patient    Sharlyne Koeneman Venetia ConstableS Mathew Postiglione 08/25/2017, 11:16 PM

## 2017-08-25 NOTE — H&P (Signed)
22 y.o. 1181w4d  G1P0 comes in c/o ctx.  Seen in MAU overnight for ctx, monitored for 3 hrs per pt and discharged home.  Reports SROM at 8:30 this morning.  Otherwise has good fetal movement and no bleeding.  Past Medical History:  Diagnosis Date  . BV (bacterial vaginosis)   . Medical history non-contributory     Past Surgical History:  Procedure Laterality Date  . NO PAST SURGERIES    . WISDOM TOOTH EXTRACTION      OB History  Gravida Para Term Preterm AB Living  1            SAB TAB Ectopic Multiple Live Births               # Outcome Date GA Lbr Len/2nd Weight Sex Delivery Anes PTL Lv  1 Current             Social History   Socioeconomic History  . Marital status: Married    Spouse name: Not on file  . Number of children: Not on file  . Years of education: Not on file  . Highest education level: Not on file  Occupational History  . Not on file  Social Needs  . Financial resource strain: Not on file  . Food insecurity:    Worry: Not on file    Inability: Not on file  . Transportation needs:    Medical: Not on file    Non-medical: Not on file  Tobacco Use  . Smoking status: Never Smoker  . Smokeless tobacco: Never Used  Substance and Sexual Activity  . Alcohol use: No  . Drug use: No  . Sexual activity: Yes    Birth control/protection: None  Lifestyle  . Physical activity:    Days per week: Not on file    Minutes per session: Not on file  . Stress: Not on file  Relationships  . Social connections:    Talks on phone: Not on file    Gets together: Not on file    Attends religious service: Not on file    Active member of club or organization: Not on file    Attends meetings of clubs or organizations: Not on file    Relationship status: Not on file  . Intimate partner violence:    Fear of current or ex partner: Not on file    Emotionally abused: Not on file    Physically abused: Not on file    Forced sexual activity: Not on file  Other Topics Concern  .  Not on file  Social History Narrative  . Not on file   Patient has no known allergies.    Prenatal Transfer Tool  Maternal Diabetes: No Genetic Screening: Normal Maternal Ultrasounds/Referrals: Normal Fetal Ultrasounds or other Referrals:  None Maternal Substance Abuse:  No Significant Maternal Medications:  None Significant Maternal Lab Results: Lab values include: Group B Strep negative  Other PNC: uncomplicated.    Vitals:   08/25/17 1037 08/25/17 1045 08/25/17 1100 08/25/17 1115  BP: 131/72 135/74 121/74 121/73  Pulse: (!) 111 (!) 106 (!) 113 (!) 106  Resp:   18   Temp:      TempSrc:        Lungs/Cor:  NAD Abdomen:  soft, gravid Ex:  no cords, erythema SVE:  Small lip/100/0 in MAU per staff FHTs:  140, moderate variability, ?decels with ctx Toco:  q2   A/P   Admitted and delivered in MAU  GBS Neg  Allyn Kenner

## 2017-08-25 NOTE — MAU Note (Signed)
CTX since 2100-now 5 mins apart.  No VB/LOF.  Membranes stripped in the office yesterday-2 cm/70% effaced.

## 2017-08-25 NOTE — MAU Note (Signed)
Pt C/O uc's since 2100 last night, seen in MAU, DC'd home @ 0230.  SROM @ 0830, clear fluid, some blood tinged.

## 2017-08-25 NOTE — MAU Note (Signed)
I have communicated with Dr. Tenny Crawoss and reviewed vital signs:  Vitals:   08/25/17 0048 08/25/17 0248  BP: 131/83 132/76  Pulse: 91 90  Resp: 19 19  Temp: 97.8 F (36.6 C)     Vaginal exam:  Dilation: 2.5 Effacement (%): 80, 90 Station: -2 Presentation: Vertex Exam by:: Latricia HeftAnna Obadiah Dennard, RN,   Also reviewed contraction pattern and that non-stress test is reactive.  It has been documented that patient is contracting every 2-3 minutes with no cervical change over 2 hours not indicating active labor.  Patient denies any other complaints.  Based on this report provider has given order for discharge.  A discharge order and diagnosis entered by a provider.   Labor discharge instructions reviewed with patient.

## 2017-08-26 ENCOUNTER — Other Ambulatory Visit: Payer: Self-pay

## 2017-08-26 ENCOUNTER — Encounter (HOSPITAL_COMMUNITY): Payer: Self-pay

## 2017-08-26 LAB — CBC
HEMATOCRIT: 28.8 % — AB (ref 36.0–46.0)
HEMOGLOBIN: 9.7 g/dL — AB (ref 12.0–15.0)
MCH: 30.3 pg (ref 26.0–34.0)
MCHC: 33.7 g/dL (ref 30.0–36.0)
MCV: 90 fL (ref 78.0–100.0)
Platelets: 159 10*3/uL (ref 150–400)
RBC: 3.2 MIL/uL — ABNORMAL LOW (ref 3.87–5.11)
RDW: 13.9 % (ref 11.5–15.5)
WBC: 17.3 10*3/uL — AB (ref 4.0–10.5)

## 2017-08-26 LAB — RPR: RPR Ser Ql: NONREACTIVE

## 2017-08-26 NOTE — Discharge Summary (Signed)
Obstetric Discharge Summary Reason for Admission: onset of labor Prenatal Procedures: ultrasound Intrapartum Procedures: spontaneous vaginal delivery Postpartum Procedures: none Complications-Operative and Postpartum: 1st degree perineal laceration Hemoglobin  Date Value Ref Range Status  08/26/2017 9.7 (L) 12.0 - 15.0 g/dL Final   HCT  Date Value Ref Range Status  08/26/2017 28.8 (L) 36.0 - 46.0 % Final    Physical Exam:  General: alert Lochia: appropriate Uterine Fundus: firm   Discharge Diagnoses: Term Pregnancy-delivered  Discharge Information: Date: 08/26/2017 Activity: pelvic rest Diet: routine Medications: PNV and Ibuprofen Condition: stable Instructions: refer to practice specific booklet Discharge to: home Follow-up Information    Philip AspenCallahan, Sidney, DO. Schedule an appointment as soon as possible for a visit in 1 month(s).   Specialty:  Obstetrics and Gynecology Contact information: [address] [place] [id number] [phone]           Newborn Data: Live born female  Birth Weight: 8 lb 1.6 oz (3675 g) APGAR: 9, 9  Newborn Delivery   Birth date/time:  08/25/2017 10:07:00 Delivery type:  Vaginal, Spontaneous     Home with mother.  Erskin Zinda E 08/26/2017, 8:35 AM

## 2017-08-26 NOTE — Lactation Note (Signed)
This note was copied from a baby's chart. Lactation Consultation Note  Patient Name: Melody Mcclain ZOXWR'UToday's Date: 08/26/2017 Reason for consult: Follow-up assessment Reviewed basics and answered questions.  Baby sleepy post circ.  Demonstrated football hold and baby latched initially but then fell asleep.  Reviewed milk coming to volume and engorgement treatment.  Lactation outpatient services and support reviewed and encouraged prn.  Maternal Data    Feeding Feeding Type: Breast Fed  LATCH Score                   Interventions    Lactation Tools Discussed/Used     Consult Status Consult Status: Complete Follow-up type: Call as needed    Huston FoleyMOULDEN, Avrie Kedzierski S 08/26/2017, 3:16 PM

## 2017-08-26 NOTE — Progress Notes (Signed)
PPD#1 Pt without complaints. Would like to go home VSSAF IMP/ Stable Plan/ Will discharge 

## 2017-08-26 NOTE — Progress Notes (Signed)
MOB was referred for history of depression/anxiety. * Referral screened out by Clinical Social Worker because none of the following criteria appear to apply: ~ History of anxiety/depression during this pregnancy, or of post-partum depression. ~ Diagnosis of anxiety and/or depression within last 3 years; Per MOB, anxiety was while MOB was in high school.  OR * MOB's symptoms currently being treated with medication and/or therapy. Please contact the Clinical Social Worker if needs arise or by MOB request.   Blaine HamperAngel Boyd-Gilyard, MSW, LCSW Clinical Social Work [phone]

## 2017-08-27 ENCOUNTER — Inpatient Hospital Stay (HOSPITAL_COMMUNITY): Admission: RE | Admit: 2017-08-27 | Payer: 59 | Source: Ambulatory Visit

## 2017-11-20 ENCOUNTER — Encounter (HOSPITAL_COMMUNITY): Payer: Self-pay

## 2020-02-27 ENCOUNTER — Other Ambulatory Visit: Payer: Self-pay | Admitting: Internal Medicine

## 2020-02-27 DIAGNOSIS — H539 Unspecified visual disturbance: Secondary | ICD-10-CM

## 2020-02-27 DIAGNOSIS — R519 Headache, unspecified: Secondary | ICD-10-CM

## 2020-03-01 ENCOUNTER — Ambulatory Visit
Admission: RE | Admit: 2020-03-01 | Discharge: 2020-03-01 | Disposition: A | Discharge status: Home / Self Care | Payer: 59 | Source: Ambulatory Visit | Attending: Internal Medicine | Admitting: Internal Medicine

## 2020-03-01 ENCOUNTER — Other Ambulatory Visit: Payer: Self-pay

## 2020-03-01 DIAGNOSIS — H539 Unspecified visual disturbance: Secondary | ICD-10-CM

## 2020-03-01 DIAGNOSIS — R519 Headache, unspecified: Secondary | ICD-10-CM

## 2020-03-01 MED ORDER — GADOBENATE DIMEGLUMINE 529 MG/ML IV SOLN
12.0000 mL | Freq: Once | INTRAVENOUS | Status: AC | PRN
  Administered 2020-03-01: 11:00:00 12 mL via INTRAVENOUS

## 2020-03-01 MED ORDER — GADOBENATE DIMEGLUMINE 529 MG/ML IV SOLN
10.0000 mL | Freq: Once | INTRAVENOUS | Status: AC | PRN
  Administered 2020-03-01: 11:00:00 10 mL via INTRAVENOUS

## 2020-06-01 NOTE — L&D Delivery Note (Signed)
Delivery Note Melody Mcclain is a G2P1001 at [redacted]w[redacted]d who had a spontaneous delivery at 1828 a viable female was delivered via LOA.  APGAR: 9, 10 ; weight 6lb10.9oz (3030g)  .     Admitted for ROM. Progressed spontaneously. Pushed for 7 minutes. Baby was delivered without difficulty. No nuchal cord.  Delayed cord clamping for 60 seconds. Delivery of placenta was spontaneous. Placenta was found to be intact, 3 -vessel cord was noted. The fundus was found to be firm. Perineum intact. Bilateral periurethral abrasions hemostatic. Estimated blood loss 100cc. Instrument and gauze counts were correct at the end of the procedure.   Placenta status: to L&D for disposal  Anesthesia:  none Episiotomy:  none Lacerations: Bilateral periurethral abrasions hemostatic - no repair required Suture Repair: none Est. Blood Loss (mL):  100  Mom to postpartum.  Baby to Couplet care / Skin to Skin.  Charlett Nose 04/18/2021, 6:48 PM

## 2020-10-16 LAB — HEPATITIS C ANTIBODY: HCV Ab: NEGATIVE

## 2020-10-16 LAB — OB RESULTS CONSOLE HEPATITIS B SURFACE ANTIGEN: Hepatitis B Surface Ag: NEGATIVE

## 2020-10-16 LAB — OB RESULTS CONSOLE RPR: RPR: NONREACTIVE

## 2020-10-16 LAB — OB RESULTS CONSOLE GC/CHLAMYDIA
Chlamydia: NEGATIVE
Gonorrhea: NEGATIVE

## 2020-10-16 LAB — OB RESULTS CONSOLE HIV ANTIBODY (ROUTINE TESTING): HIV: NONREACTIVE

## 2020-10-16 LAB — OB RESULTS CONSOLE RUBELLA ANTIBODY, IGM: Rubella: IMMUNE

## 2021-01-23 LAB — OB RESULTS CONSOLE RPR: RPR: NONREACTIVE

## 2021-04-04 LAB — OB RESULTS CONSOLE GBS: GBS: NEGATIVE

## 2021-04-16 ENCOUNTER — Other Ambulatory Visit: Payer: Self-pay | Admitting: Obstetrics and Gynecology

## 2021-04-18 ENCOUNTER — Inpatient Hospital Stay (HOSPITAL_COMMUNITY)
Admission: AD | Admit: 2021-04-18 | Discharge: 2021-04-20 | DRG: 807 | Disposition: A | Discharge status: Home / Self Care | Payer: 59 | Attending: Obstetrics and Gynecology | Admitting: Obstetrics and Gynecology

## 2021-04-18 ENCOUNTER — Encounter: Payer: Self-pay | Admitting: Student

## 2021-04-18 ENCOUNTER — Other Ambulatory Visit: Payer: Self-pay

## 2021-04-18 DIAGNOSIS — O4292 Full-term premature rupture of membranes, unspecified as to length of time between rupture and onset of labor: Principal | ICD-10-CM | POA: Diagnosis present

## 2021-04-18 DIAGNOSIS — Z3A37 37 weeks gestation of pregnancy: Secondary | ICD-10-CM

## 2021-04-18 DIAGNOSIS — Z20822 Contact with and (suspected) exposure to covid-19: Secondary | ICD-10-CM | POA: Diagnosis present

## 2021-04-18 DIAGNOSIS — O26893 Other specified pregnancy related conditions, third trimester: Secondary | ICD-10-CM | POA: Diagnosis present

## 2021-04-18 HISTORY — DX: Full-term premature rupture of membranes, unspecified as to length of time between rupture and onset of labor: O42.92

## 2021-04-18 LAB — CBC
HCT: 34.6 % — ABNORMAL LOW (ref 36.0–46.0)
Hemoglobin: 11.3 g/dL — ABNORMAL LOW (ref 12.0–15.0)
MCH: 30 pg (ref 26.0–34.0)
MCHC: 32.7 g/dL (ref 30.0–36.0)
MCV: 91.8 fL (ref 80.0–100.0)
Platelets: 170 10*3/uL (ref 150–400)
RBC: 3.77 MIL/uL — ABNORMAL LOW (ref 3.87–5.11)
RDW: 13.5 % (ref 11.5–15.5)
WBC: 11.5 10*3/uL — ABNORMAL HIGH (ref 4.0–10.5)
nRBC: 0 % (ref 0.0–0.2)

## 2021-04-18 LAB — TYPE AND SCREEN
ABO/RH(D): A POS
Antibody Screen: NEGATIVE

## 2021-04-18 LAB — RESP PANEL BY RT-PCR (FLU A&B, COVID) ARPGX2
Influenza A by PCR: NEGATIVE
Influenza B by PCR: NEGATIVE
SARS Coronavirus 2 by RT PCR: NEGATIVE

## 2021-04-18 LAB — POCT FERN TEST: POCT Fern Test: POSITIVE

## 2021-04-18 MED ORDER — PRENATAL MULTIVITAMIN CH
1.0000 | ORAL_TABLET | Freq: Every day | ORAL | Status: DC
  Administered 2021-04-19 – 2021-04-20 (×2): 1 via ORAL
  Filled 2021-04-18 (×2): qty 1

## 2021-04-18 MED ORDER — FLEET ENEMA 7-19 GM/118ML RE ENEM: 1.0000 | ENEMA | Freq: Every day | RECTAL | Status: DC | PRN

## 2021-04-18 MED ORDER — LACTATED RINGERS IV SOLN: INTRAVENOUS | Status: DC

## 2021-04-18 MED ORDER — LIDOCAINE HCL (PF) 1 % IJ SOLN: 30.0000 mL | INTRAMUSCULAR | Status: DC | PRN

## 2021-04-18 MED ORDER — FENTANYL CITRATE (PF) 100 MCG/2ML IJ SOLN: 50.0000 ug | INTRAMUSCULAR | Status: DC | PRN

## 2021-04-18 MED ORDER — OXYCODONE-ACETAMINOPHEN 5-325 MG PO TABS: 1.0000 | ORAL_TABLET | ORAL | Status: DC | PRN

## 2021-04-18 MED ORDER — WITCH HAZEL-GLYCERIN EX PADS: 1.0000 "application " | MEDICATED_PAD | CUTANEOUS | Status: DC | PRN

## 2021-04-18 MED ORDER — ACETAMINOPHEN 325 MG PO TABS: 650.0000 mg | ORAL_TABLET | ORAL | Status: DC | PRN

## 2021-04-18 MED ORDER — DOCUSATE SODIUM 100 MG PO CAPS
100.0000 mg | ORAL_CAPSULE | Freq: Two times a day (BID) | ORAL | Status: DC
  Administered 2021-04-19 – 2021-04-20 (×3): 100 mg via ORAL
  Filled 2021-04-18 (×3): qty 1

## 2021-04-18 MED ORDER — SIMETHICONE 80 MG PO CHEW: 80.0000 mg | CHEWABLE_TABLET | ORAL | Status: DC | PRN

## 2021-04-18 MED ORDER — TETANUS-DIPHTH-ACELL PERTUSSIS 5-2.5-18.5 LF-MCG/0.5 IM SUSY: 0.5000 mL | PREFILLED_SYRINGE | Freq: Once | INTRAMUSCULAR | Status: DC

## 2021-04-18 MED ORDER — ONDANSETRON HCL 4 MG PO TABS: 4.0000 mg | ORAL_TABLET | ORAL | Status: DC | PRN

## 2021-04-18 MED ORDER — ONDANSETRON HCL 4 MG/2ML IJ SOLN: 4.0000 mg | INTRAMUSCULAR | Status: DC | PRN

## 2021-04-18 MED ORDER — DIBUCAINE (PERIANAL) 1 % EX OINT: 1.0000 "application " | TOPICAL_OINTMENT | CUTANEOUS | Status: DC | PRN

## 2021-04-18 MED ORDER — OXYCODONE-ACETAMINOPHEN 5-325 MG PO TABS: 2.0000 | ORAL_TABLET | ORAL | Status: DC | PRN

## 2021-04-18 MED ORDER — ONDANSETRON HCL 4 MG/2ML IJ SOLN: 4.0000 mg | Freq: Four times a day (QID) | INTRAMUSCULAR | Status: DC | PRN

## 2021-04-18 MED ORDER — SOD CITRATE-CITRIC ACID 500-334 MG/5ML PO SOLN: 30.0000 mL | ORAL | Status: DC | PRN

## 2021-04-18 MED ORDER — OXYTOCIN BOLUS FROM INFUSION
333.0000 mL | Freq: Once | INTRAVENOUS | Status: AC
  Administered 2021-04-18: 333 mL via INTRAVENOUS

## 2021-04-18 MED ORDER — OXYCODONE HCL 5 MG PO TABS: 10.0000 mg | ORAL_TABLET | ORAL | Status: DC | PRN

## 2021-04-18 MED ORDER — LACTATED RINGERS IV SOLN: 500.0000 mL | INTRAVENOUS | Status: DC | PRN

## 2021-04-18 MED ORDER — BENZOCAINE-MENTHOL 20-0.5 % EX AERO
1.0000 "application " | INHALATION_SPRAY | CUTANEOUS | Status: DC | PRN
  Administered 2021-04-18: 1 via TOPICAL
  Filled 2021-04-18: qty 56

## 2021-04-18 MED ORDER — OXYCODONE HCL 5 MG PO TABS: 5.0000 mg | ORAL_TABLET | ORAL | Status: DC | PRN

## 2021-04-18 MED ORDER — COCONUT OIL OIL
1.0000 "application " | TOPICAL_OIL | Status: DC | PRN
  Administered 2021-04-20: 1 via TOPICAL

## 2021-04-18 MED ORDER — DIPHENHYDRAMINE HCL 25 MG PO CAPS: 25.0000 mg | ORAL_CAPSULE | Freq: Four times a day (QID) | ORAL | Status: DC | PRN

## 2021-04-18 MED ORDER — BISACODYL 10 MG RE SUPP: 10.0000 mg | Freq: Every day | RECTAL | Status: DC | PRN

## 2021-04-18 MED ORDER — OXYTOCIN-SODIUM CHLORIDE 30-0.9 UT/500ML-% IV SOLN
2.5000 [IU]/h | INTRAVENOUS | Status: DC
  Administered 2021-04-18: 2.5 [IU]/h via INTRAVENOUS
  Filled 2021-04-18: qty 500

## 2021-04-18 MED ORDER — IBUPROFEN 600 MG PO TABS
600.0000 mg | ORAL_TABLET | Freq: Four times a day (QID) | ORAL | Status: DC
  Administered 2021-04-18 – 2021-04-20 (×7): 600 mg via ORAL
  Filled 2021-04-18 (×7): qty 1

## 2021-04-18 NOTE — Lactation Note (Signed)
This note was copied from a baby's chart. Lactation Consultation Note  Patient Name: Melody Mcclain WCHEN'I Date: 04/18/2021   Age:26 hours RN Alesia Banda) will call LC  back on Vocera if mom will like to see LC in L&D.  Maternal Data    Feeding    LATCH Score                    Lactation Tools Discussed/Used    Interventions    Discharge    Consult Status      Melody Mcclain 04/18/2021, 7:31 PM

## 2021-04-18 NOTE — MAU Note (Signed)
Presents with c/o LOF since 0937,  states fluid is clear.  Denies VB. Reports irregular ctxs.  Endorses +FM.

## 2021-04-18 NOTE — Lactation Note (Signed)
This note was copied from a baby's chart. Lactation Consultation Note  Patient Name: Melody Mcclain IRSWN'I Date: 04/18/2021 Reason for consult: L&D Initial assessment;Early term 37-38.6wks Age:27 hours Mom re-latched infant at the breast when [facility] entered the room, infant previously breastfeed for 30 minutes, infant was still cuing to breastfed  and mom wanted LC to observe latch. Mom latched infant on her left breast using the cradle hold position, infant latched with depth, top lip and bottom was flanged outward. Infant breastfeed for additional 7 minutes, afterwards infant was measured.  LC did not assist with latch. Mom has flat nipples and could benefit from hand pump with 24 mm breast flange to pre-pump prior to latching infant at the breast. Mom knows to breastfeed infant according to feeding cues, 8 to 12+ or more times within 24 hours, skin to skin. Mom knows to call RN/LC if she has any further breastfeeding questions, concerns or needs further assistance with latching infant at the breast. Maternal Data Has patient been taught Hand Expression?: Yes Does the patient have breastfeeding experience prior to this delivery?: Yes How long did the patient breastfeed?: Per mom, she breastfeed her 44 year old for 3 months  Feeding Mother's Current Feeding Choice: Breast Milk  LATCH Score Latch: Grasps breast easily, tongue down, lips flanged, rhythmical sucking.  Audible Swallowing: A few with stimulation  Type of Nipple: Flat  Comfort (Breast/Nipple): Soft / non-tender  Hold (Positioning): No assistance needed to correctly position infant at breast.  LATCH Score: 8   Lactation Tools Discussed/Used    Interventions Interventions: Skin to skin;Breast compression;Education  Discharge Pump: Personal (Per mom, she has DEBP at home.) [facility] Program: No  Consult Status Consult Status: Follow-up from L&D Date: 04/19/21 Follow-up type: In-patient    Danelle Earthly 04/18/2021, 8:01 PM

## 2021-04-18 NOTE — H&P (Signed)
Melody Mcclain is a 25 y.o. female G2P1000 [redacted]w[redacted]d presenting for LOF since 0937 this AM . Contractions have increased since arriving to MAU.  No VB. Normal FM.   Pregnancy uncomplicated. History of precipitous delivery with previous pregnancy, was not able to receive epidural.   OB History     Gravida  2   Para  1   Term  1   Preterm      AB      Living  1      SAB      IAB      Ectopic      Multiple      Live Births  1          Past Medical History:  Diagnosis Date   BV (bacterial vaginosis)    Medical history non-contributory    Past Surgical History:  Procedure Laterality Date   NO PAST SURGERIES     WISDOM TOOTH EXTRACTION     Family History: family history is not on file. Social History:  reports that she has never smoked. She has never used smokeless tobacco. She reports that she does not drink alcohol and does not use drugs.     Maternal Diabetes: No Genetic Screening: Normal Maternal Ultrasounds/Referrals: Normal Fetal Ultrasounds or other Referrals:  None Maternal Substance Abuse:  No Significant Maternal Medications:  None Significant Maternal Lab Results:  Group B Strep negative Other Comments:  None  Review of Systems Per HPI Exam Physical Exam  Dilation: 3 Effacement (%): 80 Station: -3 Exam by:: Erle Crocker, RN Blood pressure 123/72, pulse 91, temperature 98.6 F (37 C), temperature source Oral, resp. rate 18, height 5\' 4"  (1.626 m), weight 72.6 kg, SpO2 100 %, unknown if currently breastfeeding. Gen: NAD, resting comfortably CVS: normal pulses Lungs: nonlabored respirations Abd: Gravid abdomen, Leop[place] 7.5# Ext: no calf edema or tenderness  Fetal testing: 135bpm, moderate variabiliy, + accels, no decels Toco: ctx q 2-4 mins  Prenatal labs: ABO, Rh:  --/--/A POS (11/18 1303) Antibody: NEG (11/18 1303) Rubella: Immune (05/18 0000) RPR: Nonreactive (08/25 0000)  HBsAg: Negative (05/18 0000)  HIV: Non-reactive (05/18  0000)  GBS: Negative/-- (11/04 0000)   Assessment/Plan: 25Y G2P1001 @ [redacted]w[redacted]d, PROM Fetal wellbeing: cat I tracing PROM, early labor: plan to recheck at 1700 or earlier if indicated, if no change will augment with pitocin Pain control: does not desire epidural  [redacted]w[redacted]d 04/18/2021, 3:55 PM

## 2021-04-19 LAB — RPR: RPR Ser Ql: NONREACTIVE

## 2021-04-19 LAB — CBC
HCT: 30 % — ABNORMAL LOW (ref 36.0–46.0)
Hemoglobin: 10 g/dL — ABNORMAL LOW (ref 12.0–15.0)
MCH: 30.1 pg (ref 26.0–34.0)
MCHC: 33.3 g/dL (ref 30.0–36.0)
MCV: 90.4 fL (ref 80.0–100.0)
Platelets: 159 10*3/uL (ref 150–400)
RBC: 3.32 MIL/uL — ABNORMAL LOW (ref 3.87–5.11)
RDW: 13.4 % (ref 11.5–15.5)
WBC: 13.8 10*3/uL — ABNORMAL HIGH (ref 4.0–10.5)
nRBC: 0 % (ref 0.0–0.2)

## 2021-04-19 MED ORDER — IBUPROFEN 600 MG PO TABS: 600.0000 mg | ORAL_TABLET | Freq: Four times a day (QID) | ORAL | 0 refills | Status: DC

## 2021-04-19 MED ORDER — ACETAMINOPHEN 325 MG PO TABS: 650.0000 mg | ORAL_TABLET | ORAL | 0 refills | Status: DC | PRN

## 2021-04-19 NOTE — Lactation Note (Signed)
This note was copied from a baby's chart. Lactation Consultation Note  Patient Name: Melody Mcclain [id number] Date: 04/19/2021 Reason for consult: Initial assessment;Early term 37-38.6wks Age:27 hours   P2 mother whose infant is now 67 hours old.  This is an ETI at 37+2 weeks.  Mother breast fed her first child (now 45-1/2 years old) for a couple of months.  NT in room completing bath when I arrived.  Baby "Melody Mcclain" had just had 1.5 mls of EBM.  Offered to attempt with latching and mother receptive.  Finger fed colostrum drops and attempted to latch.  Baby too sleepy to latch at this time.  Placed her STS on mother's chest where she fell asleep.  Reviewed breast feeding basics.  Encouraged to call her RN/LC for latch assistance as needed.    Mom made aware of O/P services, breastfeeding support groups, community resources, and our phone # for post-discharge questions.  Mother has a wireless pump for home use.  Father present.   Maternal Data Has patient been taught Hand Expression?: Yes Does the patient have breastfeeding experience prior to this delivery?: Yes How long did the patient breastfeed?: Couple of months  Feeding Mother's Current Feeding Choice: Breast Milk  LATCH Score Latch: Too sleepy or reluctant, no latch achieved, no sucking elicited.  Audible Swallowing: None  Type of Nipple: Everted at rest and after stimulation  Comfort (Breast/Nipple): Soft / non-tender  Hold (Positioning): Assistance needed to correctly position infant at breast and maintain latch.  LATCH Score: 5   Lactation Tools Discussed/Used    Interventions Interventions: Breast feeding basics reviewed;Assisted with latch;Skin to skin;Breast massage;Hand express;Reverse pressure;Breast compression;Position options;Support pillows;Adjust position;Education  Discharge Pump: Personal WIC Program: No  Consult Status Consult Status: Follow-up Date: 04/20/21 Follow-up type:  In-patient    Nathanial Arrighi R Zayla Agar 04/19/2021, 6:55 AM

## 2021-04-19 NOTE — Progress Notes (Signed)
Post Partum Day 1 Subjective: no complaints, up ad lib, and tolerating PO.  Pt would like early discharge this PM  Objective: Blood pressure 106/75, pulse 85, temperature 98.1 F (36.7 C), temperature source Oral, resp. rate 18, height 5\' 4"  (1.626 m), weight 72.6 kg, SpO2 100 %, unknown if currently breastfeeding.  Physical Exam:  General: alert and cooperative Lochia: appropriate Uterine Fundus: firm   Recent Labs    04/18/21 1330 04/19/21 0351  HGB 11.3* 10.0*  HCT 34.6* 30.0*    Assessment/Plan: Discharge home if baby able to go   LOS: 1 day   04/21/21 04/19/2021, 12:53 PM

## 2021-04-19 NOTE — Plan of Care (Signed)
  Problem: Education: Goal: Knowledge of condition will improve Outcome: Adequate for Discharge   

## 2021-04-19 NOTE — Discharge Summary (Addendum)
Postpartum Discharge Summary       Patient Name: Melody Mcclain DOB: 11/21/1995 MRN: [id number]  Date of admission: 04/18/2021 Delivery date:04/18/2021  Delivering provider: Derl Barrow E  Date of discharge: 04/20/2021  Admitting diagnosis: Full-term premature rupture of membranes [O42.92] Intrauterine pregnancy: [redacted]w[redacted]d     Secondary diagnosis:  Principal Problem:   Full-term premature rupture of membranes  Additional problems: none    Discharge diagnosis: Term Pregnancy Delivered                                              Post partum procedures: none Augmentation: N/A Complications: None  Hospital course: Onset of Labor With Vaginal Delivery      25 y.o. yo G2P2002 at [redacted]w[redacted]d was admitted in Active Labor on 04/18/2021. Patient had an uncomplicated labor course as follows:  Membrane Rupture Time/Date: 9:37 AM ,04/18/2021   Delivery Method:Vaginal, Spontaneous  Episiotomy: None  Lacerations:  None  Patient had an uncomplicated postpartum course.  She is ambulating, tolerating a regular diet, passing flatus, and urinating well. Patient is discharged home in stable condition on 04/20/21.  Newborn Data: Birth date:04/18/2021  Birth time:6:28 PM  Gender:Female  Living status:Living  Apgars:9 ,10  Weight:3030 g    Physical exam  Vitals:   04/19/21 1107 04/19/21 1621 04/19/21 2300 04/20/21 0545  BP: 106/75 93/65 106/64 107/76  Pulse: 85 89 74 100  Resp: 18 16  18   Temp: 98.1 F (36.7 C) 97.6 F (36.4 C)  98.4 F (36.9 C)  TempSrc: Oral Oral  Oral  SpO2:  100%  100%  Weight:      Height:       General: alert and cooperative Lochia: appropriate Uterine Fundus: firm  Labs: Lab Results  Component Value Date   WBC 13.8 (H) 04/19/2021   HGB 10.0 (L) 04/19/2021   HCT 30.0 (L) 04/19/2021   MCV 90.4 04/19/2021   PLT 159 04/19/2021   No flowsheet data found. Edinburgh Score: Edinburgh Postnatal Depression Scale Screening Tool 08/25/2017  I have been  able to laugh and see the funny side of things. 0  I have looked forward with enjoyment to things. 0  I have blamed myself unnecessarily when things went wrong. 0  I have been anxious or worried for no good reason. 0  I have felt scared or panicky for no good reason. 0  Things have been getting on top of me. 0  I have been so unhappy that I have had difficulty sleeping. 0  I have felt sad or miserable. 0  I have been so unhappy that I have been crying. 0  The thought of harming myself has occurred to me. 0  Edinburgh Postnatal Depression Scale Total 0     After visit meds:  Allergies as of 04/20/2021   No Known Allergies      Medication List     TAKE these medications    acetaminophen 325 MG tablet Commonly known as: Tylenol Take 2 tablets (650 mg total) by mouth every 4 (four) hours as needed (for pain scale < 4).   ibuprofen 600 MG tablet Commonly known as: ADVIL Take 1 tablet (600 mg total) by mouth every 6 (six) hours.   prenatal multivitamin Tabs tablet Take 1 tablet by mouth daily at 12 noon.         Discharge home  in stable condition Infant Feeding: Breast Infant Disposition:home with mother Discharge instruction: per After Visit Summary and Postpartum booklet. Activity: Advance as tolerated. Pelvic rest for 6 weeks.  Diet: routine diet Future Appointments:No future appointments. Follow up Visit:  Follow-up Information     Charlett Nose, MD. Schedule an appointment as soon as possible for a visit in 4 week(s).   Specialty: Obstetrics and Gynecology Why: routine postpartum Contact information: [address] [place] [address] [phone]                  Please schedule this patient for a In person postpartum visit in 4 weeks with the following provider: MD.  Delivery mode:  Vaginal, Spontaneous  Anticipated Birth Control:   partner vasectomy   04/20/2021 Oliver Pila, MD

## 2021-04-20 NOTE — Lactation Note (Signed)
This note was copied from a baby's chart. Lactation Consultation Note  Patient Name: Melody Mcclain GYJEH'U Date: 04/20/2021 Reason for consult: Follow-up assessment;Nipple pain/trauma;Early term 37-38.6wks Age:27 hours  LC in to visit with P2 Mom of [redacted]w[redacted]d infant.  Baby's weight loss at 8.6% with good output.  Mom is stating that baby has been sleepy last 24 hrs.  Mom concerned and started formula supplementation as she is wanting to be discharged home.  Baby sleeping on Mom STS.  Reviewed plan to stimulate her milk supply with regular double pumping.  Mom's breasts are heavier today.  Nipples sore.  Right nipple with small crack and some bleeding.  LC disassembled pump parts, washed, rinsed and placed in separate bin to dry.  Baby moved to crib to assist with pumping.  Baby burped and cueing.  Stopped pumping to assist Mom with positioning and latching.  Mom started using cradle hold, then switched to cross cradle.  LC added pillow support.  Mom moving her hand that's supporting her breast to a U hold and told to not massage breast, but to compress with sucking.  After a couple attempts, baby latched deeply with consistent sucking and deep jaw extensions and swallowing identified.  Mom comfortable.  Plan- 1- STS as much as possible 2- Offer breast with cues, awaken if baby has been sleeping 3 hrs. 3- Pump both breasts 15 mins on initiation setting 4- Feed baby EBM+/formula by paced bottle after breast feeding. 5- ask for help with latching or pumping prn.  Mom asked if baby wasn't hungry or waking for supplement after a good breastfeed with regular swallowing, what she should do.  LC recommended STS and offer breast again within 3 hrs.  RN asked to provide coconut oil to nipples.  Mom to use EBM first and then a dab of coconut oil.  LATCH Score Latch: Grasps breast easily, tongue down, lips flanged, rhythmical sucking. (after a couple attempts)  Audible Swallowing: Spontaneous  and intermittent  Type of Nipple: Everted at rest and after stimulation  Comfort (Breast/Nipple): Engorged, cracked, bleeding, large blisters, severe discomfort  Hold (Positioning): Assistance needed to correctly position infant at breast and maintain latch.  LATCH Score: 7   Lactation Tools Discussed/Used Tools: Pump;Flanges Flange Size: 24 Breast pump type: Double-Electric Breast Pump Pump Education: Setup, frequency, and cleaning;Milk Storage Reason for Pumping: Support milk supply/supplementing/ ET infant/ 9% weight loss Pumping frequency: Q 3hrs Pumped volume: 5 mL  Interventions Interventions: Breast feeding basics reviewed;Assisted with latch;Skin to skin;Breast massage;Hand express;Breast compression;Adjust position;Support pillows;Position options;Expressed milk;Coconut oil;DEBP;Pace feeding  Discharge Pump: Personal (Elvie hand's free DEBP)  Consult Status Consult Status: Follow-up Date: 04/21/21 Follow-up type: In-patient    Judee Clara 04/20/2021, 1:19 PM

## 2021-04-20 NOTE — Progress Notes (Signed)
Post Partum Day 2 Subjective: no complaints and up ad libPatient had to stay another day due to baby weight loss.  Her milk is not fully in yet, so she is nursing and then supplementing a bit with formula  Objective: Blood pressure 107/76, pulse 100, temperature 98.4 F (36.9 C), temperature source Oral, resp. rate 18, height 5\' 4"  (1.626 m), weight 72.6 kg, SpO2 100 %, unknown if currently breastfeeding.  Physical Exam:  General: alert and cooperative Lochia: appropriate Uterine Fundus: firm   Recent Labs    04/18/21 1330 04/19/21 0351  HGB 11.3* 10.0*  HCT 34.6* 30.0*    Assessment/Plan: Discharge home, encouraged pt that once milk comes in she will no longer need to supplement--she breast fed her first child   LOS: 2 days   04/21/21 04/20/2021, 7:59 AM

## 2021-04-21 ENCOUNTER — Ambulatory Visit: Payer: Self-pay

## 2021-04-21 NOTE — Lactation Note (Signed)
This note was copied from a baby's chart. Lactation Consultation Note  Patient Name: Melody Mcclain AJGOT'L Date: 04/21/2021   Age:26 hours  Mom's milk is coming to volume. She recently pumped 45 mL. Mom has been pumping often, offering breast, & then supplementing. Based off of Mom's volumes & that Mom says, "she latches great!," we can now simplify the feeding routine.  Plan: Finish 1st breast first, assessing for softening of breast. Offer other breast. If infant does not take the other breast, she can  pump the other breast for comfort, if needed. (Mom has an Elvie pump at home).  If bottle-feeding is needed to supplement, Mom to give enough volume so that infant is content.    Mom knows how to reach [place] for post-d/c questions. Melody Mcclain [facility] 04/21/2021, 10:23 AM

## 2021-04-24 IMAGING — MR MR HEAD WO/W CM
13 series · 48 of 48 positions shown · IV contrast (12 ML MULTIHANCE)
Comparison: None.
COMPARISON: None.

Addendum:
CLINICAL DATA: No intractable headache; visual disturbance.

EXAM:
MRI HEAD WITHOUT AND WITH CONTRAST
TECHNIQUE: Multiplanar, multiecho pulse sequences of the brain and surrounding
structures were obtained without and with intravenous contrast.
CONTRAST:  12mL MULTIHANCE GADOBENATE DIMEGLUMINE 529 MG/ML IV SOLN

[Series 5: T1 · sagittal · 4.0mm · 0.75mm/px · 1 of 31 slices shown (1 of 3)]
[im 1/31]
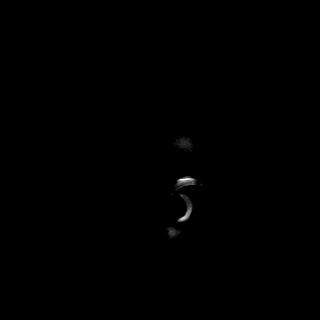

[Series 6: DWI · axial · 3.0mm · 1.44mm/px · z∈[-68,+64]mm · 5 of 84 slices shown (1 of 4)]
[im 1/84]
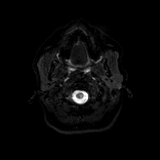
[im 21/84]
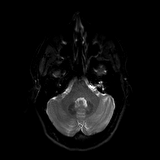
[im 42/84]
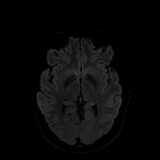
[im 63/84]
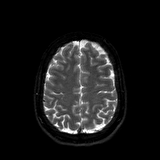
[im 84/84]
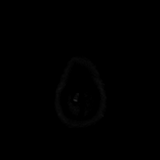

[Series 7: DWI · axial · 3.0mm · 1.44mm/px · z∈[-68,+64]mm · 2 of 42 slices shown (2 of 4)]
[im 1/42]
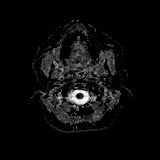
[im 42/42]
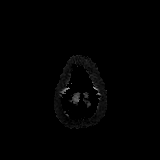

[Series 8: DWI · coronal · 5.0mm · 1.44mm/px · 4 of 60 slices shown (3 of 4)]
[im 1/60]
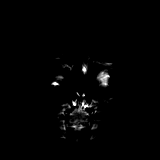
[im 20/60]
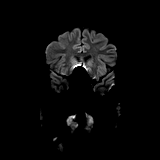
[im 40/60]
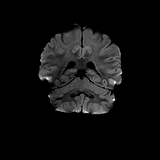
[im 60/60]
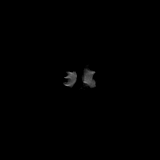

[Series 9: DWI · coronal · 5.0mm · 1.44mm/px · 2 of 30 slices shown (4 of 4)]
[im 1/30]
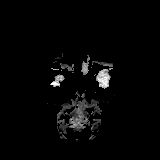
[im 30/30]
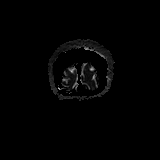

[Series 10: T2 · axial · 4.0mm · 0.36mm/px · z∈[-69,+63]mm · 2 of 27 slices shown]
[im 1/27]
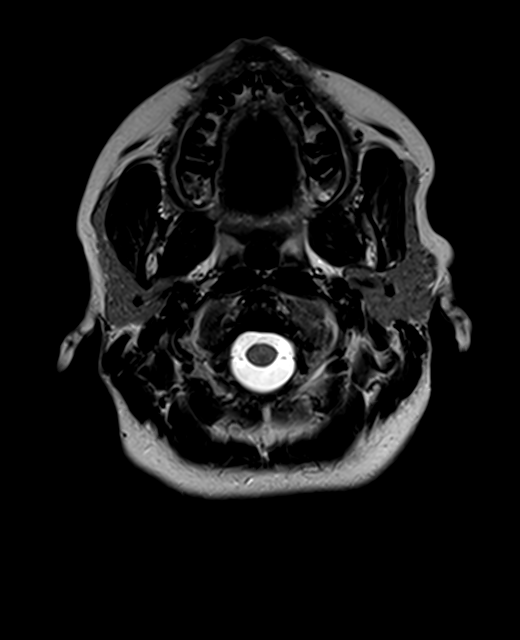
[im 27/27]
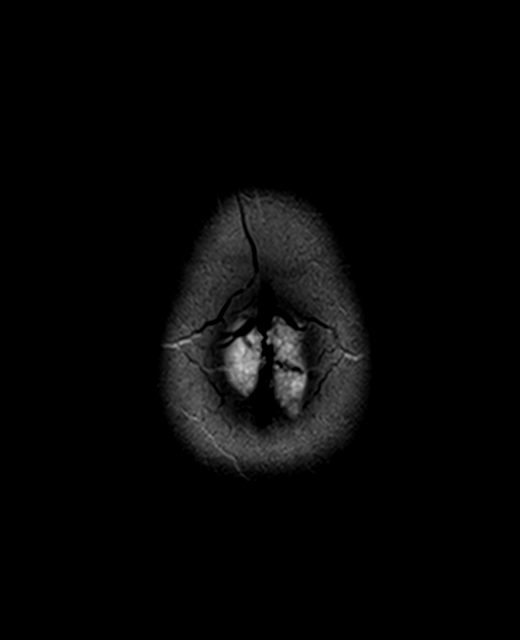

[Series 11: FLAIR · axial · 3.0mm · 0.72mm/px · z∈[-75,+72]mm · 2 of 26 slices shown]
[im 1/26]
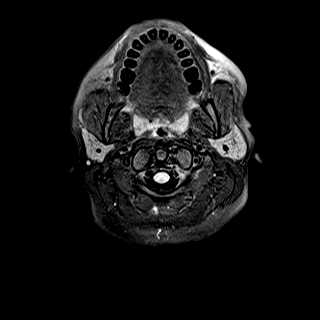
[im 26/26]
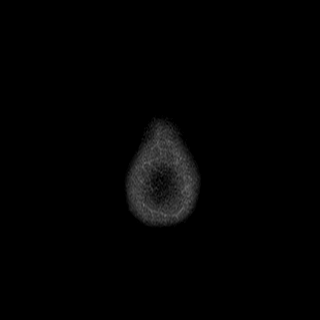

[Series 13: swi_images · axial · 1.5mm · 0.90mm/px · z∈[-73,+67]mm · 6 of 96 slices shown]
[im 1/96]
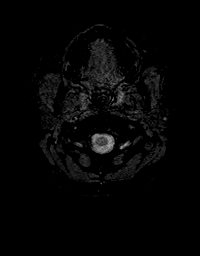
[im 20/96]
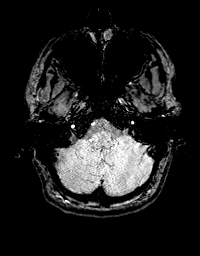
[im 39/96]
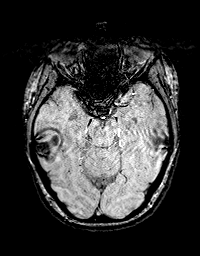
[im 58/96]
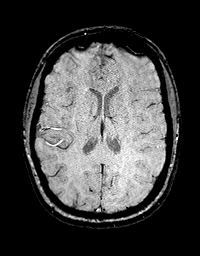
[im 77/96]
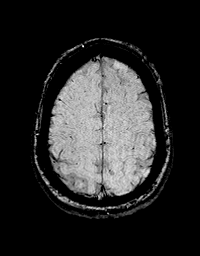
[im 96/96]
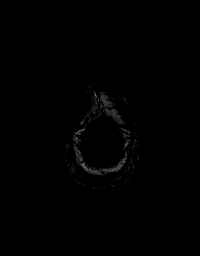

[Series 14: T1 · axial · 1.0mm · 0.94mm/px · z∈[-94,+58]mm · 9 of 159 slices shown (2 of 3)]
[im 1/159]
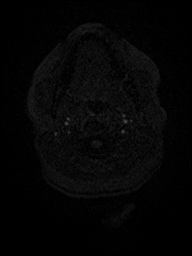
[im 20/159]
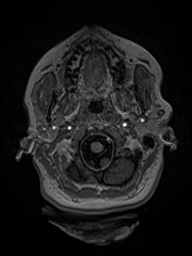
[im 40/159]
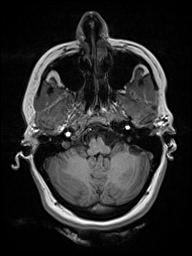
[im 60/159]
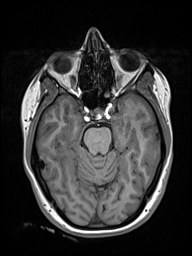
[im 80/159]
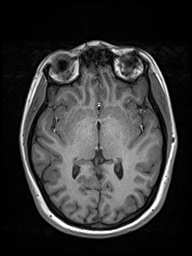
[im 99/159]
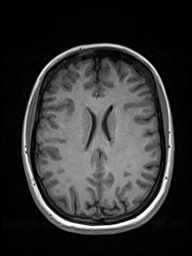
[im 119/159]
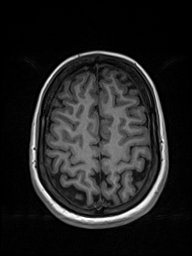
[im 139/159]
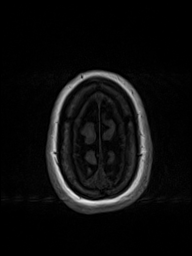
[im 159/159]
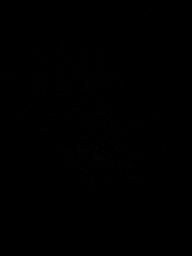

[Series 15: T2 post-contrast · coronal · 4.0mm · 0.36mm/px · 2 of 33 slices shown]
[im 1/33]
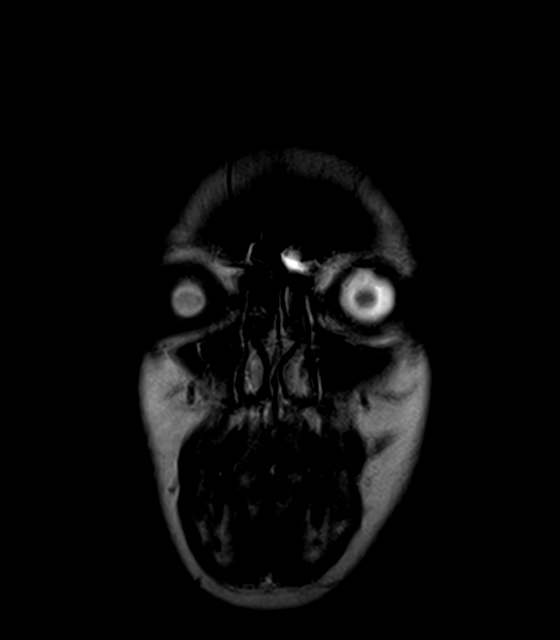
[im 33/33]
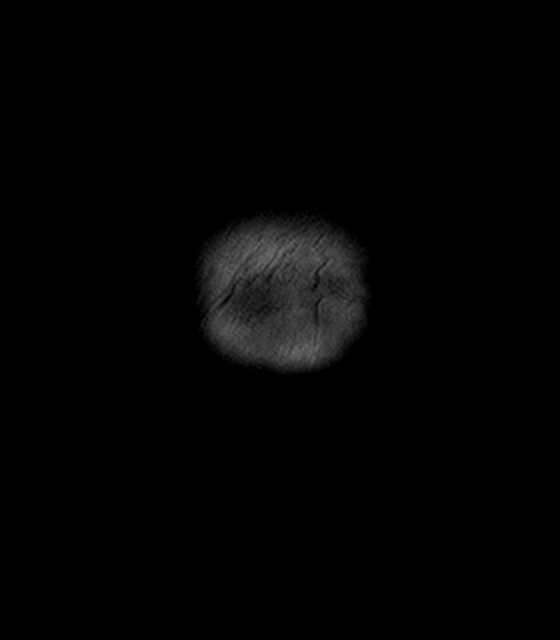

[Series 16: T1 · axial · 1.0mm · 0.94mm/px · z∈[-82,+74]mm · 9 of 160 slices shown (3 of 3)]
[im 1/160]
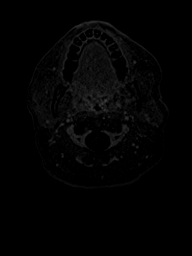
[im 20/160]
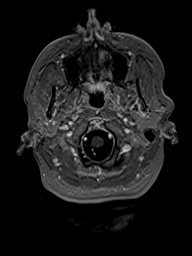
[im 40/160]
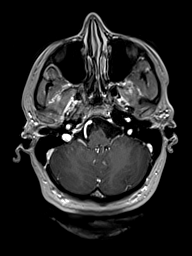
[im 60/160]
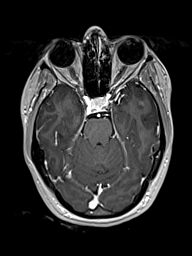
[im 80/160]
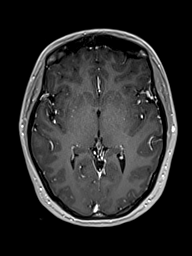
[im 100/160]
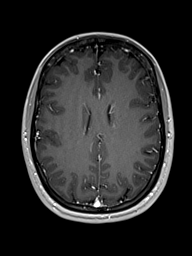
[im 120/160]
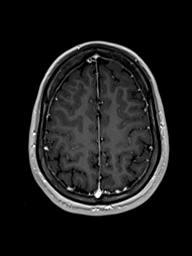
[im 140/160]
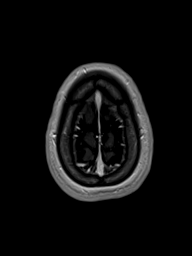
[im 160/160]
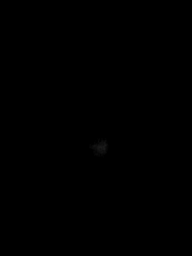

[Series 17: T1 post-contrast · coronal · 4.0mm · 0.72mm/px · 2 of 33 slices shown (1 of 2)]
[im 1/33]
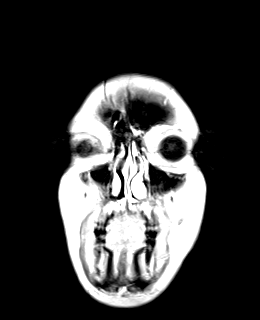
[im 33/33]
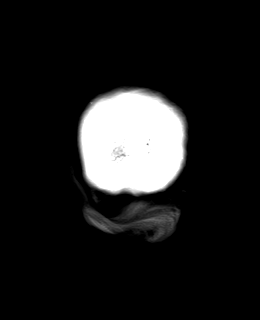

[Series 18: T1 post-contrast · sagittal · 4.0mm · 0.75mm/px · 2 of 30 slices shown (2 of 2)]
[im 1/30]
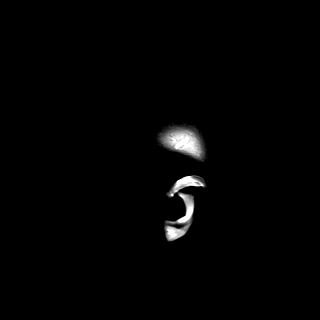
[im 30/30]
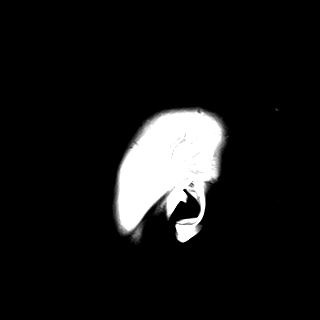

[48 of 48 positions shown; findings below may reference images not displayed]

FINDINGS: Brain: No acute infarction, hemorrhage, hydrocephalus, extra-axial
collection or mass lesion.

The brain parenchyma has normal morphology and signal
characteristics. No focus of abnormal contrast enhancement seen.

Vascular: Normal flow voids.

Skull and upper cervical spine: Normal marrow signal.

Sinuses/Orbits: Mild mucosal thickening scattered throughout the
paranasal sinuses. Small mucous retention cyst in the left sphenoid
sinus. The orbits are maintained.

Other: None.
IMPRESSION: 1. Normal MRI of the brain.
2. Mild paranasal sinus disease.

ADDENDUM:
Pituitary/Sella: The pituitary has normal size with no obvious
lesion. The infundibulum is midline. There is no mass effect on the
optic chiasm or optic nerves.Normal cavernous sinus and cavernous
internal carotid artery flow voids. Please, note that routine brain
MRI protocol was utilized for this study.

*** End of Addendum ***
FINDINGS: Brain: No acute infarction, hemorrhage, hydrocephalus, extra-axial
collection or mass lesion.

The brain parenchyma has normal morphology and signal
characteristics. No focus of abnormal contrast enhancement seen.

Vascular: Normal flow voids.

Skull and upper cervical spine: Normal marrow signal.

Sinuses/Orbits: Mild mucosal thickening scattered throughout the
paranasal sinuses. Small mucous retention cyst in the left sphenoid
sinus. The orbits are maintained.

Other: None.
IMPRESSION: 1. Normal MRI of the brain.
2. Mild paranasal sinus disease.

## 2021-04-29 ENCOUNTER — Telehealth (HOSPITAL_COMMUNITY): Payer: Self-pay | Admitting: *Deleted

## 2021-04-29 NOTE — Telephone Encounter (Signed)
Attempted hospital discharge follow-up call. Left message for patient to return RN call. Deforest Hoyles, RN 04/29/21, [phone]

## 2021-05-01 ENCOUNTER — Inpatient Hospital Stay (HOSPITAL_COMMUNITY): Payer: 59

## 2021-05-01 ENCOUNTER — Inpatient Hospital Stay (HOSPITAL_COMMUNITY): Admission: AD | Admit: 2021-05-01 | Payer: 59 | Source: Home / Self Care | Admitting: Obstetrics and Gynecology

## 2022-11-03 ENCOUNTER — Ambulatory Visit (INDEPENDENT_AMBULATORY_CARE_PROVIDER_SITE_OTHER): Payer: 59

## 2022-11-03 ENCOUNTER — Ambulatory Visit (INDEPENDENT_AMBULATORY_CARE_PROVIDER_SITE_OTHER): Payer: 59 | Admitting: Podiatry

## 2022-11-03 DIAGNOSIS — M84375A Stress fracture, left foot, initial encounter for fracture: Secondary | ICD-10-CM

## 2022-11-03 DIAGNOSIS — G5762 Lesion of plantar nerve, left lower limb: Secondary | ICD-10-CM

## 2022-11-03 NOTE — Progress Notes (Signed)
  Subjective:  Patient ID: Melody Mcclain, female    DOB: 1995-12-22,  MRN: [id number]  Chief Complaint  Patient presents with   Foot Pain    Patient complaining of pain in between her 3rd and 4th toe and ball of left foot. Patient saw her primary and obtained xray's and the provider stated there was no issue noted. Patient was given a steroid injection with relief only for 6 hours.     27 y.o. female presents with concern for pain in the left foot between the third and fourth toe and third metatarsal area.  Patient previously had an x-ray at the internal medicine office and there was no obvious fracture.  She had a steroid injection in the area which lasted only for 6 hours of relief and then the pain returned.  Pain is primarily when she is walking and stepping down.  Past Medical History:  Diagnosis Date   BV (bacterial vaginosis)    Medical history non-contributory     Allergies  Allergen Reactions   Epinephrine Palpitations    ROS: Negative except as per HPI above  Objective:  General: AAO x3, NAD  Dermatological: With inspection and palpation of the right and left lower extremities there are no open sores, no preulcerative lesions, no rash or signs of infection present. Nails are of normal length thickness and coloration.   Vascular:  Dorsalis Pedis artery and Posterior Tibial artery pedal pulses are 2/4 bilateral.  Capillary fill time < 3 sec to all digits.   Neruologic: Grossly intact via light touch bilateral. Protective threshold intact to all sites bilateral.   Musculoskeletal: Focal fain with palpation along the third metatarsal primarily near the neck area.  There is also mild pain in the third intermetatarsal space.  Pain does not radiate into the third or fourth toe and no numbness.  Pain worse with palpation plantarly and along the plantar aspect of the third metatarsal neck area  Gait: Unassisted, Nonantalgic.   No images are attached to the  encounter.  Radiographs:  Date: 11/03/2022 XR the left foot Weightbearing AP/Lateral/Oblique   Findings: Tension direct to the third metatarsal neck area there is possible cortical thickening and radiodensity as well as possible faint radiolucency consistent with healing stress fracture in the third metatarsal neck area this is seen on both the oblique and lateral views. Assessment:   1. Stress fracture, left foot, initial encounter for fracture   2. Morton's neuroma of left foot      Plan:  Patient was evaluated and treated and all questions answered.  # Stress fracture of the third metatarsal healing left foot -Discussed with patient her symptoms and radiographic findings point to a stress fracture of the third metatarsal neck area -Recommend cam boot immobilization for the next month -She can be weightbearing as tolerated allowed her to decrease activity levels and wear the cam boot when she is walking -I also advised high-dose vitamin D and calcium supplementation to aid in fracture healing  Return in about 4 weeks (around 12/01/2022) for Follow-up left third metatarsal stress fracture.          Corinna Gab, DPM [facility] / [facility]

## 2022-11-09 ENCOUNTER — Telehealth: Payer: Self-pay | Admitting: Podiatry

## 2022-11-09 NOTE — Telephone Encounter (Signed)
Pt left message today at 1205pm stating she has a small stress fracture and was put in a boot and it is not helping. Please advise

## 2022-11-10 NOTE — Telephone Encounter (Signed)
Left message for pt that Dr Annamary Rummage has recommended she reduce the weight bearing in the boot to non weight bearing. If any question to please call us back. I also tol pt if she would like to move her appt up we could possibly do that as well.

## 2022-12-01 ENCOUNTER — Ambulatory Visit (INDEPENDENT_AMBULATORY_CARE_PROVIDER_SITE_OTHER): Payer: 59 | Admitting: Podiatry

## 2022-12-01 ENCOUNTER — Ambulatory Visit (INDEPENDENT_AMBULATORY_CARE_PROVIDER_SITE_OTHER): Payer: 59

## 2022-12-01 DIAGNOSIS — M84375D Stress fracture, left foot, subsequent encounter for fracture with routine healing: Secondary | ICD-10-CM | POA: Diagnosis not present

## 2022-12-01 DIAGNOSIS — G5762 Lesion of plantar nerve, left lower limb: Secondary | ICD-10-CM

## 2022-12-01 DIAGNOSIS — M84375A Stress fracture, left foot, initial encounter for fracture: Secondary | ICD-10-CM

## 2022-12-01 NOTE — Progress Notes (Addendum)
  Subjective:  Patient ID: Melody Mcclain, female    DOB: 1996/04/11,  MRN: [id number]  Chief Complaint  Patient presents with   Follow-up    Follow-up left third metatarsal stress fracture. Patient is doing better. Patient wore the cam boot for 2.5 weeks.     27 y.o. female presents for follow up of left 3rd metatarsal stress fracture vs neuroma.  Patient states she is doing much better she is having minimal to no pain in the left foot at this time wear the boot for about 2 weeks and then stopped wearing it and says that the pain went away after she stopped wearing it.   Past Medical History:  Diagnosis Date   BV (bacterial vaginosis)    Medical history non-contributory     Allergies  Allergen Reactions   Epinephrine Palpitations    ROS: Negative except as per HPI above  Objective:  General: AAO x3, NAD  Dermatological: With inspection and palpation of the right and left lower extremities there are no open sores, no preulcerative lesions, no rash or signs of infection present. Nails are of normal length thickness and coloration.   Vascular:  Dorsalis Pedis artery and Posterior Tibial artery pedal pulses are 2/4 bilateral.  Capillary fill time < 3 sec to all digits.   Neruologic: Grossly intact via light touch bilateral. Protective threshold intact to all sites bilateral.   Musculoskeletal: Minimal to no pain along the third metatarsal neck area where there was previously pain with palpation.  There is some mild pain with plantar palpation of the third metatarsal head however it is not severe.  Much improved from prior  Gait: Unassisted, Nonantalgic.   No images are attached to the encounter.  Radiographs:  Date: 12/01/2022 XR the left foot Weightbearing AP/Lateral/Oblique   Findings: Attention directed to the third metatarsal neck/midshaft area there is noted to be hypertrophic bone formation along the shaft of the metatarsal especially on the medial side consistent with  healed fracture. Assessment:   1. Stress fracture, left foot, subsequent encounter for fracture with routine healing   2. Morton's neuroma of left foot       Plan:  Patient was evaluated and treated and all questions answered.  # Stress fracture of the third metatarsal left foot -Appears much improved radiographically as well as clinically -Discontinue cam boot use and walk in regular good supportive shoes -Continue to avoid high-impact activities like running for the next month or so however she can get back to low impact aerobic activity swimming biking etc. -Continue vitamin D and calcium supplementation -Follow-up as needed          Corinna Gab, DPM [facility] / [facility]

## 2023-12-09 LAB — OB RESULTS CONSOLE HEPATITIS B SURFACE ANTIGEN: Hepatitis B Surface Ag: NEGATIVE

## 2023-12-09 LAB — OB RESULTS CONSOLE GC/CHLAMYDIA
Chlamydia: NEGATIVE
Neisseria Gonorrhea: NEGATIVE

## 2023-12-09 LAB — HEPATITIS C ANTIBODY: HCV Ab: NEGATIVE

## 2023-12-09 LAB — OB RESULTS CONSOLE RPR: RPR: NONREACTIVE

## 2023-12-09 LAB — OB RESULTS CONSOLE RUBELLA ANTIBODY, IGM: Rubella: IMMUNE

## 2023-12-09 LAB — OB RESULTS CONSOLE HIV ANTIBODY (ROUTINE TESTING): HIV: NONREACTIVE

## 2023-12-28 ENCOUNTER — Other Ambulatory Visit: Payer: Self-pay | Admitting: Obstetrics and Gynecology

## 2023-12-28 DIAGNOSIS — O30042 Twin pregnancy, dichorionic/diamniotic, second trimester: Secondary | ICD-10-CM

## 2024-01-05 ENCOUNTER — Ambulatory Visit: Payer: PRIVATE HEALTH INSURANCE | Attending: Obstetrics and Gynecology

## 2024-01-05 DIAGNOSIS — O285 Abnormal chromosomal and genetic finding on antenatal screening of mother: Secondary | ICD-10-CM | POA: Diagnosis not present

## 2024-01-05 DIAGNOSIS — Z3A14 14 weeks gestation of pregnancy: Secondary | ICD-10-CM | POA: Diagnosis not present

## 2024-01-05 DIAGNOSIS — Z148 Genetic carrier of other disease: Secondary | ICD-10-CM | POA: Diagnosis not present

## 2024-01-05 NOTE — Progress Notes (Signed)
 [facility] at [facility] [address] Phone:  [phone]   Fax:  [phone]      In-Person Genetic Counseling Clinic Note:   I spoke with 28 y.o. Melody Mcclain today to discuss her NIPS and carrier screening results. She was referred by Melody Rosaline BRAVO, MD. She was accompanied by FOB Lang.   Pregnancy History:    [id number]. EGA: [redacted]w[redacted]d by LMP. EDD: 07/04/2024. Twin pregnancy was seen during early ultrasounds; at 10w scan, twin B was nonviable. Melody Mcclain has two healthy children. Reports she takes PNVs. Denies major personal health concerns. Denies bleeding, infections, and fevers in this pregnancy. Denies using tobacco, alcohol, or street drugs in this pregnancy.   Family History:    A three-generation pedigree was created and scanned into Epic under the Media tab.  FOB reports his father d. 52 yo had a history of strokes and heart disease. He reports he had a hole in his heart. Heart defects can be isolated and have multifactorial inheritance. Heart defects can also be due to chromosomal or genetic differences. The risk for recurrence depends on the etiology of the heart defect and the exact type of defect involved. Without medical or genetic testing reports, recurrence risk is difficult to assess; however, it is likely not to be above the general population risk of ~1%. Second trimester targeted ultrasound can detect most significant cardiac defects.  Maternal ethnicity reported as White and paternal ethnicity reported as White. Denies Ashkenazi Jewish ancestry.  Family history not remarkable for consanguinity, individuals with birth defects, intellectual disability, autism spectrum disorder, multiple spontaneous abortions, still births, or unexplained neonatal death.   Atypical Finding on NIPS:  Melody Mcclain had [facility] NIPS performed at her OB office which returned as an atypical finding. The screen was ordered as a  vanishing twin pregnancy, as a demise of twin B was noted at the 10w ultrasound. The result returned as an atypical finding, suggesting a possible pregnancy that started as triplets or higher order multiple gestation followed by loss of one or more of the fetuses. In this instance, the lab detected more genetic (SNP) profiles than what would have been expected from a vanishing twin pregnancy, which could be due to this pregnancy starting as a triplet/higher order multiple pregnancy. Other possible causes of this atypical finding include use of an egg donor or fetal triploidy, which were reviewed with the patient. No egg donor was used during the pregnancy.  We reviewed the technical aspects of NIPS and [facility]'s SNP-based technology. Noninvasive prenatal screening (NIPS) analyzes cell-free DNA originating from the placenta that is found in the maternal blood circulation during pregnancy to provide information regarding the presence or absence of extra DNA for chromosomes 13, 18, and 21 as well as the sex chromosomes. We reviewed that a vanishing twin can interfere with the genetic screening results of the viable twin; however, [facility] recently started accepting samples for vanishing twin pregnancies. [facility] is unable to screen pregnancies that started as triplets or higher order multiple gestations, even if there is a loss of one or multiple fetuses. Therefore, no result was provided for the viable fetus, and [facility] will not accept a repeat sample.  We discussed other screening and diagnostic testing options, such as NIPS through a different laboratory, amniocentesis, and routine ultrasounds, to determine aneuploidy risk to the fetus. The technical aspects, benefits, risks, and limitations were reviewed including the 1 in 500 risk for miscarriage with amniocentesis.  We reviewed [facility] offers NIPS that can be accepted in cases of vanishing twin/triplet pregnancies. I have reached out to [facility] to determine if  they would perform the test in this case. Of note, [facility] does not screen for triploidy. The patient elected to wait until her anatomy ultrasound and will then determine if she wishes to proceed with [facility] NIPS. She declined amniocentesis.   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   Based on these results, she is not at an increased risk of having a child with fragile X syndrome. In some cases, intermediate size repeat alleles can expand to a premutation in future generations. It is estimated that around 14% of intermediate alleles expand to a premutation allele in future generations.  Individuals who are intermediate allele carriers are not expected to have any additional medical concerns regarding this result. Intermediate sized alleles are typically between 45 and 54 CGG repeats. These individuals have a chance that each of their children may inherit a slightly larger CGG repeat allele. It is possible for intermediate allele carriers to have children who are premutation carriers. Premutation carriers have between 55 and 200 CGG repeats. Females who are premutation carriers are at risk of having children with fragile X syndrome. Female premutation allele carriers may exhibit symptoms of fragile X premature ovarian insufficiency (FXPOI), which include early menopause and infertility. Males and females with the premutation alleles may have attention deficit disorder (ADD) or other concerns involving attention, behavior, and social skills. They are also at risk of developing a neurologic condition called fragile X associated tremor/ataxia syndrome (FXTAS) in late adulthood. Therefore, Melody Mcclain's children may seek genetic counseling in the future to determine risk of fragile X syndrome to any of their pregnancies.   We discussed that regarding this  current pregnancy, there is a 50% chance that Melody Mcclain would pass down the intermediate allele. According to current data, there would be an approximately 7% chance that this pregnancy would have the premutation allele.    We briefly discussed amniocentesis including the risks as well as the implications of genetic testing prenatally or during childhood for an adult-onset condition that may be associated with premutation fragile X alleles.    Finally, of the other 13 conditions screened for, Emma-Lee was not found to be a carrier. A negative result significantly reduces but does not eliminate the chance that the individual is a carrier. Please see report for details.   Newborn Screening. The [facility]  Newborn Screening (NBS) program will screen all newborn babies for cystic fibrosis, spinal muscular atrophy, hemoglobinopathies, and numerous other conditions.   Plan of Care:   Declined amniocentesis. Patient will inform us  on if she wishes to proceed with repeat NIPS through [facility] during her anatomy ultrasound. [facility] anatomy ultrasound on 02/10/2024.   Informed consent was obtained. All questions were answered.   70 minutes were spent on the date of the encounter in service to the patient including preparation, face-to-face consultation, discussion of test reports and available next steps, pedigree construction, genetic risk assessment, documentation, and care coordination.    Thank you for sharing in the care of [facility] with us .  Please do not hesitate to contact us  at [phone] if you have any questions.   Lauraine Bodily, MS, [facility] Certified Genetic Counselor   Genetic counseling student involved in appointment: No.

## 2024-01-28 DIAGNOSIS — O30009 Twin pregnancy, unspecified number of placenta and unspecified number of amniotic sacs, unspecified trimester: Secondary | ICD-10-CM | POA: Insufficient documentation

## 2024-02-10 ENCOUNTER — Ambulatory Visit (HOSPITAL_BASED_OUTPATIENT_CLINIC_OR_DEPARTMENT_OTHER): Payer: PRIVATE HEALTH INSURANCE

## 2024-02-10 ENCOUNTER — Ambulatory Visit: Payer: PRIVATE HEALTH INSURANCE | Attending: Obstetrics and Gynecology | Admitting: Obstetrics

## 2024-02-10 ENCOUNTER — Other Ambulatory Visit: Payer: Self-pay | Admitting: *Deleted

## 2024-02-10 VITALS — BP 105/57 | HR 83

## 2024-02-10 DIAGNOSIS — Z363 Encounter for antenatal screening for malformations: Secondary | ICD-10-CM | POA: Insufficient documentation

## 2024-02-10 DIAGNOSIS — Z148 Genetic carrier of other disease: Secondary | ICD-10-CM

## 2024-02-10 DIAGNOSIS — Z3A19 19 weeks gestation of pregnancy: Secondary | ICD-10-CM

## 2024-02-10 DIAGNOSIS — O285 Abnormal chromosomal and genetic finding on antenatal screening of mother: Secondary | ICD-10-CM

## 2024-02-10 DIAGNOSIS — O30042 Twin pregnancy, dichorionic/diamniotic, second trimester: Secondary | ICD-10-CM

## 2024-02-10 DIAGNOSIS — O3122X2 Continuing pregnancy after intrauterine death of one fetus or more, second trimester, fetus 2: Secondary | ICD-10-CM | POA: Insufficient documentation

## 2024-02-10 DIAGNOSIS — O3110X0 Continuing pregnancy after spontaneous abortion of one fetus or more, unspecified trimester, not applicable or unspecified: Secondary | ICD-10-CM

## 2024-02-10 DIAGNOSIS — O321XX0 Maternal care for breech presentation, not applicable or unspecified: Secondary | ICD-10-CM | POA: Insufficient documentation

## 2024-02-10 DIAGNOSIS — O280 Abnormal hematological finding on antenatal screening of mother: Secondary | ICD-10-CM

## 2024-02-10 DIAGNOSIS — O30049 Twin pregnancy, dichorionic/diamniotic, unspecified trimester: Secondary | ICD-10-CM

## 2024-02-10 NOTE — Progress Notes (Signed)
 MFM Consult Note  Melody Mcclain is currently at 19 weeks and 2 days.  She was seen for a detailed fetal anatomy scan as her cell free DNA test showed atypical findings possibly due to a vanishing twin.  This pregnancy initially started off as a dichorionic, diamniotic twin gestation. A fetal demise of twin B was noted at her 12-week ultrasound performed in your office.    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  She denies any significant past medical history and denies any problems in her current pregnancy.    Sonographic findings Single intrauterine pregnancy at 19w 2d  Fetal cardiac activity:  Observed and appears normal. Presentation: Breech. The anatomic structures that were well seen appear normal without evidence of soft markers. Due to poor acoustic windows some structures remain suboptimally visualized. Fetal biometry shows the estimated fetal weight at the 53 percentile.  Amniotic fluid: Within normal limits.  MVP: 6. cm. Placenta: Anterior. Adnexa: No adnexal mass visualized. Cervical length: 3.5 cm.  The patient was informed that anomalies may be missed due to technical limitations. If the fetus is in a suboptimal position or maternal habitus is increased, visualization of the fetus in the maternal uterus may be impaired.  The following were discussed during today's consultation:  Abnormal cell free DNA test showing atypical findings  The remnants of the demised twin were noted on today's ultrasound exam.  There appears to be a thick dividing membrane separating the two fetuses, indicating that this was a dichorionic, diamniotic twin gestation.    She was advised that the atypical findings noted on her cell free DNA test is most likely due to the demised twin.  The patient was reassured that the demise of one twin in a dichorionic twin gestation will not affect the other.   The patient  was offered and declined a repeat cell free DNA test run through a different lab ([facility]) and declined an amniocentesis for definitive diagnosis of fetal chromosomal abnormalities.    She was comfortable with the views of the fetal anatomy we obtained today.    Due to her abnormal cell free DNA test, she should continue to be followed with growth ultrasounds throughout her pregnancy.  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    She declined an amniocentesis for definitive prenatal diagnosis today.  A follow-up growth scan was scheduled in our office in 5 weeks.    Should the fetus continue to appear within normal limits at her next exam, she can have her future ultrasounds (after the next exam) performed in your office.    The patient stated that all of her questions were answered today.  A total of 45 minutes was spent counseling and coordinating the care for this patient.  Greater than 50% of the time was spent in direct face-to-face contact.

## 2024-02-17 ENCOUNTER — Ambulatory Visit: Payer: Self-pay

## 2024-02-17 ENCOUNTER — Other Ambulatory Visit: Payer: Self-pay

## 2024-03-15 ENCOUNTER — Ambulatory Visit (HOSPITAL_BASED_OUTPATIENT_CLINIC_OR_DEPARTMENT_OTHER): Payer: PRIVATE HEALTH INSURANCE

## 2024-03-15 ENCOUNTER — Ambulatory Visit: Payer: PRIVATE HEALTH INSURANCE | Attending: Obstetrics and Gynecology | Admitting: Obstetrics and Gynecology

## 2024-03-15 DIAGNOSIS — O3112X0 Continuing pregnancy after spontaneous abortion of one fetus or more, second trimester, not applicable or unspecified: Secondary | ICD-10-CM | POA: Diagnosis not present

## 2024-03-15 DIAGNOSIS — Z3A24 24 weeks gestation of pregnancy: Secondary | ICD-10-CM | POA: Diagnosis not present

## 2024-03-15 DIAGNOSIS — O285 Abnormal chromosomal and genetic finding on antenatal screening of mother: Secondary | ICD-10-CM | POA: Insufficient documentation

## 2024-03-15 DIAGNOSIS — O30002 Twin pregnancy, unspecified number of placenta and unspecified number of amniotic sacs, second trimester: Secondary | ICD-10-CM | POA: Diagnosis not present

## 2024-03-15 DIAGNOSIS — Z148 Genetic carrier of other disease: Secondary | ICD-10-CM

## 2024-03-15 DIAGNOSIS — O3110X0 Continuing pregnancy after spontaneous abortion of one fetus or more, unspecified trimester, not applicable or unspecified: Secondary | ICD-10-CM

## 2024-03-15 DIAGNOSIS — Z362 Encounter for other antenatal screening follow-up: Secondary | ICD-10-CM | POA: Diagnosis not present

## 2024-03-15 NOTE — Progress Notes (Signed)
 Maternal-Fetal Medicine Consultation  Name: Melody Mcclain  MRN: [id number]  GA: [id number] [redacted]w[redacted]d   Patient returned for completion of fetal anatomy. - Atypical finding on sex chromosomes. - Intermediate carrier for Fragile X syndrome. Patient had genetic counseling.  Fetal demise of a co-twin was noted at 10-week ultrasound.  She was counseled on repeating cell-free fetal DNA screening with another company, [facility]. Patient wanted to wait till completion of anatomy scan.  Obstetrical history significant for 2 term vaginal deliveries.  Ultrasound Normal fetal growth and amniotic fluid.  Fetal anatomical survey was completed and appears normal. I reassured the patient of the findings.  Abnormal cell free fetal DNA screening I counseled the patient that the abnormal results is more likely from fetal demise of one twin.  I informed the patient that although ultrasound is reassuring, it does not detect all anomalies.  Some fetuses with chromosomal anomalies may not show any ultrasound features. Only amniocentesis will give a definitive result on the fetal karyotype.  I explained amniocentesis procedure and possible complication of preterm delivery (1 and 500 procedures). Patient understands the limitations of ultrasound and opted not to have amniocentesis.  I recommended follow-up fetal growth assessment at [redacted] weeks gestation for reassurance and that may be performed at your office.  Recommendations - Fetal growth assessment at [redacted] weeks gestation at your office.     Consultation including face-to-face (more than 50%) counseling 20 minutes.

## 2024-03-30 ENCOUNTER — Other Ambulatory Visit: Payer: Self-pay

## 2024-03-30 ENCOUNTER — Encounter (HOSPITAL_COMMUNITY): Payer: Self-pay | Admitting: Obstetrics and Gynecology

## 2024-03-30 ENCOUNTER — Inpatient Hospital Stay (HOSPITAL_COMMUNITY)
Admission: AD | Admit: 2024-03-30 | Discharge: 2024-03-31 | Disposition: A | Discharge status: Home / Self Care | Payer: PRIVATE HEALTH INSURANCE | Attending: Obstetrics and Gynecology | Admitting: Obstetrics and Gynecology

## 2024-03-30 DIAGNOSIS — Z3A26 26 weeks gestation of pregnancy: Secondary | ICD-10-CM

## 2024-03-30 DIAGNOSIS — O4702 False labor before 37 completed weeks of gestation, second trimester: Secondary | ICD-10-CM | POA: Diagnosis not present

## 2024-03-30 DIAGNOSIS — O26893 Other specified pregnancy related conditions, third trimester: Secondary | ICD-10-CM | POA: Diagnosis not present

## 2024-03-30 DIAGNOSIS — N898 Other specified noninflammatory disorders of vagina: Secondary | ICD-10-CM

## 2024-03-30 DIAGNOSIS — O368120 Decreased fetal movements, second trimester, not applicable or unspecified: Secondary | ICD-10-CM | POA: Insufficient documentation

## 2024-03-30 DIAGNOSIS — K900 Celiac disease: Secondary | ICD-10-CM | POA: Insufficient documentation

## 2024-03-30 NOTE — MAU Provider Note (Signed)
 Chief Complaint:  Decreased Fetal Movement, Vaginal Discharge, and Rupture of Membranes   HPI   Event Date/Time   First Provider Initiated Contact with Patient 03/30/24 2343      Melody Mcclain is a 28 y.o. G3P2002 at [redacted]w[redacted]d who presents to maternity admissions reporting vaginal fluid. She had a URI recently and had an episode of posttussive vomiting. During the emesis episode, she was unsure if she peed herself or if her water broke. She reports that she had another episode of wetness this morning. She reports irregular Darol Irving, and that she has felt less fetal movement than she is used to. Denies vaginal bleeding.  Pregnancy Course: Receives care at [facility]. Prenatal records reviewed.   Past Medical History:  Diagnosis Date   Bacterial vaginitis 05/12/2017   BV (bacterial vaginosis)    Full-term premature rupture of membranes 04/18/2021   Medical history non-contributory    Normal labor 08/25/2017   OB History  Gravida Para Term Preterm AB Living  3 2 2   2   SAB IAB Ectopic Multiple Live Births     0 2    # Outcome Date GA Lbr Len/2nd Weight Sex Type Anes PTL Lv  3 Current           2 Term 04/18/21 [redacted]w[redacted]d 08:44 / 00:07 3030 g F Vag-Spont None  LIV  1 Term 08/25/17 [redacted]w[redacted]d    Vag-Spont      Past Surgical History:  Procedure Laterality Date   BBL     NO PAST SURGERIES     WISDOM TOOTH EXTRACTION     Family History  Problem Relation Age of Onset   Alcohol abuse Neg Hx    Arthritis Neg Hx    Asthma Neg Hx    Birth defects Neg Hx    Cancer Neg Hx    COPD Neg Hx    Diabetes Neg Hx    Depression Neg Hx    Drug abuse Neg Hx    Early death Neg Hx    Hearing loss Neg Hx    Heart disease Neg Hx    Hyperlipidemia Neg Hx    Hypertension Neg Hx    Kidney disease Neg Hx    Learning disabilities Neg Hx    Mental illness Neg Hx    Mental retardation Neg Hx    Miscarriages / Stillbirths Neg Hx    Stroke Neg Hx    Vision loss Neg Hx    Varicose Veins Neg Hx     Social History   Tobacco Use   Smoking status: Never   Smokeless tobacco: Never  Vaping Use   Vaping status: Never Used  Substance Use Topics   Alcohol use: No   Drug use: No   Allergies  Allergen Reactions   Gluten Meal     Other Reaction(s): Not available   Epinephrine Palpitations   Medications Prior to Admission  Medication Sig Dispense Refill Last Dose/Taking   Prenatal Vit-Fe Fumarate-FA (PRENATAL MULTIVITAMIN) TABS tablet Take 1 tablet by mouth daily at 12 noon.   03/30/2024 at  9:00 PM    I have reviewed patient's Past Medical Hx, Surgical Hx, Family Hx, Social Hx, medications and allergies.   ROS  Pertinent items noted in HPI and remainder of comprehensive ROS otherwise negative.   PHYSICAL EXAM  Patient Vitals for the past 24 hrs:  BP Temp Temp src Pulse Resp SpO2 Height Weight  03/30/24 2328 117/66 -- -- 87 -- -- -- --  03/30/24 2318 118/80 97.9 F (36.6 C) Oral 97 17 100 % -- --  03/30/24 2306 -- -- -- -- -- -- 5' 4 (1.626 m) 65.2 kg    Constitutional: Well-developed, well-nourished female in no acute distress.  HEENT: atraumatic, normocephalic. Neck has normal ROM. EOM intact. Cardiovascular: normal rate & rhythm, warm and well-perfused Respiratory: normal effort, no problems with respiration noted GI: Abd soft, non-tender, non-distended MSK: Extremities nontender, no edema, normal ROM Skin: warm and dry. Acyanotic, no jaundice or pallor. Neurologic: Alert and oriented x 4. No abnormal coordination. Psychiatric: Normal mood. Speech not slurred, not rapid/pressured. Patient is cooperative. Pelvic exam: VULVA: normal appearing vulva with no masses, tenderness or lesions, VAGINA: normal appearing vagina with normal color and discharge, no lesions, CERVIX: normal appearing cervix without discharge or lesions, no pooling, cervical os closed, exam chaperoned by Griselda Punter RN.   Fetal Tracing: Baseline: 135 Variability: moderate Accelerations: yes    Decelerations: none Toco: no uterine contractions     Labs: No results found for this or any previous visit (from the past 24 hours).  Imaging:  No results found.  MDM & MAU COURSE  MDM: Moderate  MAU Course: -Vital signs within normal limits. -No pooling on exam and cervical os closed. Fern collected. -Fern negative. No rupture of membranes.   Orders Placed This Encounter  Procedures   Urinalysis, Routine w reflex microscopic -Urine, Clean Catch   Fern Test   Discharge patient   No orders of the defined types were placed in this encounter.   ASSESSMENT   1. Vaginal discharge during pregnancy in third trimester   2. [redacted] weeks gestation of pregnancy     PLAN  Discharge home in stable condition with preterm labor precautions.      Allergies as of 03/31/2024       Reactions   Gluten Meal    Other Reaction(s): Not available   Epinephrine Palpitations        Medication List     TAKE these medications    prenatal multivitamin Tabs tablet Take 1 tablet by mouth daily at 12 noon.        Joesph DELENA Sear, PA

## 2024-03-30 NOTE — MAU Note (Signed)
 MAU Triage Note  Melody Mcclain is a 28 y.o. at [redacted]w[redacted]d here in MAU reporting: reports, she's been feeling decreased FM for the past few days. Reports baby has been moving, but not as much as normal. Reports on 10/27, she vomited and was unsure if she peed on herself, but had lots of clear fluid come out. Also reports 2 more occurrences of this last night and this morning. Reports she isn't continuing to leak. Reports irregular ctx. Reports she put her fingers in her vagina this evening, and believes her cervix was high and felt like it was open. Denies VB.    Pain score: denies Vitals:   03/30/24 2318  BP: 118/80  Pulse: 97  Resp: 17  Temp: 97.9 F (36.6 C)  SpO2: 100%     FHT: 138  Lab orders placed from triage: UA

## 2024-03-30 NOTE — Discharge Instructions (Signed)

## 2024-03-31 LAB — URINALYSIS, ROUTINE W REFLEX MICROSCOPIC
Bacteria, UA: NONE SEEN
Bilirubin Urine: NEGATIVE
Glucose, UA: NEGATIVE mg/dL
Hgb urine dipstick: NEGATIVE
Ketones, ur: NEGATIVE mg/dL
Leukocytes,Ua: NEGATIVE
Nitrite: NEGATIVE
Protein, ur: NEGATIVE mg/dL
Specific Gravity, Urine: 1.018 (ref 1.005–1.030)
pH: 6 (ref 5.0–8.0)

## 2024-03-31 LAB — POCT FERN TEST: POCT Fern Test: NEGATIVE

## 2024-05-30 LAB — OB RESULTS CONSOLE GBS: GBS: NEGATIVE

## 2024-06-01 NOTE — L&D Delivery Note (Signed)
 Delivery Note At 4:39 PM a viable and healthy female was delivered via Vaginal, Spontaneous (Presentation:   Occiput Anterior).  APGAR: 9, 10; weight pending   Placenta status: Spontaneous, Intact.  Cord: 3 vessels with the following complications: None.   Within a few minutes of amniotomy being performed patient's husband called out to say baby was coming.  Upon RN entering the room the baby's head had delivered.  RN delivered the body and passed the infant to the waiting maternal abdomen.  I arrived as the infant was being placed on the maternal abdomen.  Following about a 2-minute delay the umbilical cord was clamped and cut.  The placenta was then delivered spontaneously, intact, with three-vessel cord.  No lacerations required repair.  All sponge, needle, instrument counts were correct.  Mother and baby are doing well following the delivery.  Patient received IM Pitocin  due to lack of IV access following delivery.   Anesthesia: None Episiotomy: None Lacerations: None Suture Repair: N/A Est. Blood Loss (mL):  178 cc  Mom to postpartum.  Baby to Couplet care / Skin to Skin.  [name] 06/21/2024, 4:59 PM

## 2024-06-17 ENCOUNTER — Inpatient Hospital Stay (HOSPITAL_COMMUNITY)
Admission: AD | Admit: 2024-06-17 | Discharge: 2024-06-17 | Disposition: A | Discharge status: Home / Self Care | Payer: PRIVATE HEALTH INSURANCE | Attending: Obstetrics and Gynecology | Admitting: Obstetrics and Gynecology

## 2024-06-17 ENCOUNTER — Encounter (HOSPITAL_COMMUNITY): Payer: Self-pay | Admitting: Obstetrics and Gynecology

## 2024-06-17 DIAGNOSIS — O471 False labor at or after 37 completed weeks of gestation: Secondary | ICD-10-CM

## 2024-06-17 DIAGNOSIS — Z3A37 37 weeks gestation of pregnancy: Secondary | ICD-10-CM

## 2024-06-17 NOTE — MAU Note (Signed)
 Melody Mcclain is a 29 y.o. at [redacted]w[redacted]d here in MAU reporting:  Pt states she has lost her mucous plug multiple times. Pt states she has a high pain tolerance and has a hx of having fast deliveries. Pt reports having her last baby in the ED.  She called the Labor line and they said to come in and be evaluated due to her previous labor hx.   Landy Stains. Last CE was 3cm.  Has felt her baby move less but has felt movement.  No lOF. No bleeding.   Vitals:   06/17/24 2318  BP: 139/78  Pulse: 89  Resp: 18  Temp: 98.2 F (36.8 C)    Pain score: back pain 2/10 and pelvic pressure.   FHT: 146 Lab orders placed from triage: labor eval.

## 2024-06-17 NOTE — MAU Provider Note (Signed)
 Melody Mcclain is a 29 y.o. G61P2002 female at [redacted]w[redacted]d  RN Labor check, not seen by provider SVE by RN: Dilation: 2.5 Effacement (%): 50 Station: -3 Exam by:: Oleh Loges RN NST: FHR baseline 130 bpm, Variability: moderate, Accelerations:present, Decelerations:  Absent= Cat 1/Reactive Toco: irregular ctx  D/C home  [name] CNM, WHNP-BC 06/17/2024 11:34 PM

## 2024-06-21 ENCOUNTER — Encounter (HOSPITAL_COMMUNITY): Payer: Self-pay | Admitting: Obstetrics and Gynecology

## 2024-06-21 ENCOUNTER — Inpatient Hospital Stay (HOSPITAL_COMMUNITY)
Admission: AD | Admit: 2024-06-21 | Discharge: 2024-06-22 | DRG: 807 | Disposition: A | Discharge status: Home / Self Care | Payer: PRIVATE HEALTH INSURANCE | Attending: Obstetrics and Gynecology | Admitting: Obstetrics and Gynecology

## 2024-06-21 ENCOUNTER — Other Ambulatory Visit: Payer: Self-pay

## 2024-06-21 DIAGNOSIS — Z3A38 38 weeks gestation of pregnancy: Secondary | ICD-10-CM | POA: Diagnosis not present

## 2024-06-21 DIAGNOSIS — Z148 Genetic carrier of other disease: Secondary | ICD-10-CM | POA: Diagnosis not present

## 2024-06-21 DIAGNOSIS — O26893 Other specified pregnancy related conditions, third trimester: Secondary | ICD-10-CM | POA: Diagnosis present

## 2024-06-21 LAB — CBC
HCT: 32.5 % — ABNORMAL LOW (ref 36.0–46.0)
Hemoglobin: 11.2 g/dL — ABNORMAL LOW (ref 12.0–15.0)
MCH: 31.6 pg (ref 26.0–34.0)
MCHC: 34.5 g/dL (ref 30.0–36.0)
MCV: 91.8 fL (ref 80.0–100.0)
Platelets: 172 K/uL (ref 150–400)
RBC: 3.54 MIL/uL — ABNORMAL LOW (ref 3.87–5.11)
RDW: 13.2 % (ref 11.5–15.5)
WBC: 14.9 K/uL — ABNORMAL HIGH (ref 4.0–10.5)
nRBC: 0 % (ref 0.0–0.2)

## 2024-06-21 LAB — TYPE AND SCREEN
ABO/RH(D): A POS
Antibody Screen: NEGATIVE

## 2024-06-21 MED ORDER — ONDANSETRON HCL 4 MG PO TABS: 4.0000 mg | ORAL_TABLET | ORAL | Status: DC | PRN

## 2024-06-21 MED ORDER — TETANUS-DIPHTH-ACELL PERTUSSIS 5-2-15.5 LF-MCG/0.5 IM SUSP: 0.5000 mL | Freq: Once | INTRAMUSCULAR | Status: DC

## 2024-06-21 MED ORDER — ACETAMINOPHEN 325 MG PO TABS: 650.0000 mg | ORAL_TABLET | ORAL | Status: DC | PRN

## 2024-06-21 MED ORDER — LACTATED RINGERS IV SOLN: INTRAVENOUS | Status: DC

## 2024-06-21 MED ORDER — FENTANYL CITRATE (PF) 100 MCG/2ML IJ SOLN: 50.0000 ug | INTRAMUSCULAR | Status: DC | PRN

## 2024-06-21 MED ORDER — DIBUCAINE (PERIANAL) 1 % EX OINT: 1.0000 | TOPICAL_OINTMENT | CUTANEOUS | Status: DC | PRN

## 2024-06-21 MED ORDER — SENNOSIDES-DOCUSATE SODIUM 8.6-50 MG PO TABS
2.0000 | ORAL_TABLET | Freq: Every day | ORAL | Status: DC
  Filled 2024-06-21: qty 2

## 2024-06-21 MED ORDER — OXYCODONE-ACETAMINOPHEN 5-325 MG PO TABS: 1.0000 | ORAL_TABLET | ORAL | Status: DC | PRN

## 2024-06-21 MED ORDER — OXYTOCIN 10 UNIT/ML IJ SOLN: 10.0000 [IU] | Freq: Once | INTRAMUSCULAR | Status: DC

## 2024-06-21 MED ORDER — COCONUT OIL OIL: 1.0000 | TOPICAL_OIL | Status: DC | PRN

## 2024-06-21 MED ORDER — BENZOCAINE-MENTHOL 20-0.5 % EX AERO: 1.0000 | INHALATION_SPRAY | CUTANEOUS | Status: DC | PRN

## 2024-06-21 MED ORDER — OXYCODONE-ACETAMINOPHEN 5-325 MG PO TABS: 2.0000 | ORAL_TABLET | ORAL | Status: DC | PRN

## 2024-06-21 MED ORDER — ONDANSETRON HCL 4 MG/2ML IJ SOLN: 4.0000 mg | INTRAMUSCULAR | Status: DC | PRN

## 2024-06-21 MED ORDER — OXYCODONE HCL 5 MG PO TABS: 10.0000 mg | ORAL_TABLET | ORAL | Status: DC | PRN

## 2024-06-21 MED ORDER — LIDOCAINE HCL (PF) 1 % IJ SOLN: 30.0000 mL | INTRAMUSCULAR | Status: DC | PRN

## 2024-06-21 MED ORDER — OXYTOCIN-SODIUM CHLORIDE 30-0.9 UT/500ML-% IV SOLN: 2.5000 [IU]/h | INTRAVENOUS | Status: DC | PRN

## 2024-06-21 MED ORDER — OXYCODONE HCL 5 MG PO TABS: 5.0000 mg | ORAL_TABLET | ORAL | Status: DC | PRN

## 2024-06-21 MED ORDER — METHYLERGONOVINE MALEATE 0.2 MG/ML IJ SOLN: 0.2000 mg | INTRAMUSCULAR | Status: DC | PRN

## 2024-06-21 MED ORDER — ZOLPIDEM TARTRATE 5 MG PO TABS: 5.0000 mg | ORAL_TABLET | Freq: Every evening | ORAL | Status: DC | PRN

## 2024-06-21 MED ORDER — WITCH HAZEL-GLYCERIN EX PADS: 1.0000 | MEDICATED_PAD | CUTANEOUS | Status: DC | PRN

## 2024-06-21 MED ORDER — OXYTOCIN-SODIUM CHLORIDE 30-0.9 UT/500ML-% IV SOLN: 2.5000 [IU]/h | INTRAVENOUS | Status: DC

## 2024-06-21 MED ORDER — ONDANSETRON HCL 4 MG/2ML IJ SOLN: 4.0000 mg | Freq: Four times a day (QID) | INTRAMUSCULAR | Status: DC | PRN

## 2024-06-21 MED ORDER — SIMETHICONE 80 MG PO CHEW: 80.0000 mg | CHEWABLE_TABLET | ORAL | Status: DC | PRN

## 2024-06-21 MED ORDER — DIPHENHYDRAMINE HCL 25 MG PO CAPS: 25.0000 mg | ORAL_CAPSULE | Freq: Four times a day (QID) | ORAL | Status: DC | PRN

## 2024-06-21 MED ORDER — OXYTOCIN BOLUS FROM INFUSION: 333.0000 mL | Freq: Once | INTRAVENOUS | Status: DC

## 2024-06-21 MED ORDER — IBUPROFEN 600 MG PO TABS
600.0000 mg | ORAL_TABLET | Freq: Four times a day (QID) | ORAL | Status: DC
  Administered 2024-06-21 – 2024-06-22 (×2): 600 mg via ORAL
  Filled 2024-06-21 (×3): qty 1

## 2024-06-21 MED ORDER — LACTATED RINGERS IV SOLN: 500.0000 mL | INTRAVENOUS | Status: DC | PRN

## 2024-06-21 MED ORDER — OXYTOCIN 10 UNIT/ML IJ SOLN
INTRAMUSCULAR | Status: AC
  Administered 2024-06-21: 10 [IU]
  Filled 2024-06-21: qty 1

## 2024-06-21 MED ORDER — SOD CITRATE-CITRIC ACID 500-334 MG/5ML PO SOLN: 30.0000 mL | ORAL | Status: DC | PRN

## 2024-06-21 MED ORDER — PRENATAL MULTIVITAMIN CH: 1.0000 | ORAL_TABLET | Freq: Every day | ORAL | Status: DC

## 2024-06-21 MED ORDER — METHYLERGONOVINE MALEATE 0.2 MG PO TABS: 0.2000 mg | ORAL_TABLET | ORAL | Status: DC | PRN

## 2024-06-21 NOTE — H&P (Signed)
 Melody Mcclain is a 28 y.o. female presenting for labor  29 year old gravida 3 para 2-0-0-2 at 38+1 presents to maternity admissions complaining of painful contractions and was found to be in labor.  On exam her cervix was 6/90/-2.  In the office yesterday her cervix was 5/50/-2.  Her pregnancy has been complicated by vanishing twin at 8 weeks. OB History     Gravida  3   Para  2   Term  2   Preterm      AB      Living  2      SAB      IAB      Ectopic      Multiple  0   Live Births  2          Past Medical History:  Diagnosis Date   Bacterial vaginitis 05/12/2017   BV (bacterial vaginosis)    Full-term premature rupture of membranes 04/18/2021   Medical history non-contributory    Normal labor 08/25/2017   Past Surgical History:  Procedure Laterality Date   BBL     NO PAST SURGERIES     WISDOM TOOTH EXTRACTION     Family History: family history is not on file. Social History:  reports that she has never smoked. She has never used smokeless tobacco. She reports that she does not drink alcohol and does not use drugs.     Maternal Diabetes: No Genetic Screening: Abnormal:  Results: Other: NIPT initially performed at 10 weeks.  Results returned atypical.  These were felt to be due to vanishing twin.  Declined repeat NIPT or amnio.  Positive Fragile X carrier Maternal Ultrasounds/Referrals: Normal Fetal Ultrasounds or other Referrals:  Referred to Materal Fetal Medicine  Maternal Substance Abuse:  No Significant Maternal Medications:  None Significant Maternal Lab Results:  Group B Strep negative Number of Prenatal Visits:greater than 3 verified prenatal visits Maternal Vaccinations:TDap Other Comments:  None  Review of Systems History Dilation: 6 Effacement (%): 90 Station: -2 Exam by:: N. Dario, RN Blood pressure 120/78, pulse (!) 116, temperature 97.7 F (36.5 C), temperature source Oral, resp. rate 18, height 5' 4 (1.626 m), weight 70.9 kg,  last menstrual period 09/28/2023, SpO2 100%, unknown if currently breastfeeding. Exam Physical Exam  AO x 3, uncomfortable appearing Gravid, soft Fetal heart rate 140, reactive, category 1 tracing Toco irregular Prenatal labs: ABO, Rh:  A+ Antibody:  Negative Rubella:  Immune RPR:   Nonreactive HBsAg:   Negative HIV:   Nonreactive GBS:   Negative No results found for this or any previous visit (from the past 24 hours).   Assessment/Plan: 1) admit 2) epidural on request 3) expectant management   Melody Mcclain 06/21/2024, 2:21 PM

## 2024-06-21 NOTE — MAU Note (Signed)
 MAU Labor Triage Note: .Melody Mcclain is a 29 y.o. at [redacted]w[redacted]d here in MAU reporting:  Contractions every: 3-4 minutes Onset of ctx: 0500, got more regular at 1100 Pain Score: 6  Pain Location: Abdomen  ROM: denies Vaginal Bleeding: bloody show Last SVE: 3cm in office Labor Pain Management Plan: Planning unmedicated  GBS: Negative  Fetal Movement: Reports positive FM FHT: Fetal Heart Rate Mode: External Baseline Rate (A): 135 bpm  Vitals:   06/21/24 1411  BP: 120/78  Pulse: (!) 116  Resp: 18  Temp: 97.7 F (36.5 C)  SpO2: 100%      Lab orders placed from triage: MAU Labor Eval OB Office: [facility] St[facility]

## 2024-06-21 NOTE — Progress Notes (Signed)
 Pt called out to desk saying she was pushing. Huband met nurse at door saying the head was out. RN. Hannahmarie Asberry delivered the rest of the baby. Dr Okey in room immediately after delivery.

## 2024-06-21 NOTE — Progress Notes (Addendum)
 Patient ID: Melody Mcclain, female   DOB: 16-Jan-1996, 29 y.o.   MRN: [id number]  S) feeling contractions but overall comfortable O) afebrile vital signs stable AO x 3, NAD Gravid, soft Fetal heart rate 130, reactive, category 1 tracing Cervix 8/90/-2 Toco Q 2-3  AROM clear fluid A/P 1) expectant management 2) fetal wellbeing reassuring

## 2024-06-21 NOTE — MAU Note (Signed)

## 2024-06-22 LAB — CBC
HCT: 30.8 % — ABNORMAL LOW (ref 36.0–46.0)
Hemoglobin: 10.6 g/dL — ABNORMAL LOW (ref 12.0–15.0)
MCH: 32.1 pg (ref 26.0–34.0)
MCHC: 34.4 g/dL (ref 30.0–36.0)
MCV: 93.3 fL (ref 80.0–100.0)
Platelets: 169 K/uL (ref 150–400)
RBC: 3.3 MIL/uL — ABNORMAL LOW (ref 3.87–5.11)
RDW: 13.2 % (ref 11.5–15.5)
WBC: 17.8 K/uL — ABNORMAL HIGH (ref 4.0–10.5)
nRBC: 0 % (ref 0.0–0.2)

## 2024-06-22 LAB — SYPHILIS: RPR W/REFLEX TO RPR TITER AND TREPONEMAL ANTIBODIES, TRADITIONAL SCREENING AND DIAGNOSIS ALGORITHM: RPR Ser Ql: NONREACTIVE

## 2024-06-22 MED ORDER — POLYETHYLENE GLYCOL 3350 17 GM/SCOOP PO POWD: 17.0000 g | Freq: Every day | ORAL | Status: AC | PRN

## 2024-06-22 MED ORDER — ACETAMINOPHEN 325 MG PO TABS: 650.0000 mg | ORAL_TABLET | Freq: Four times a day (QID) | ORAL | Status: AC | PRN

## 2024-06-22 MED ORDER — IBUPROFEN 600 MG PO TABS: 600.0000 mg | ORAL_TABLET | Freq: Four times a day (QID) | ORAL | 0 refills | Status: AC

## 2024-06-22 NOTE — Discharge Summary (Signed)
 "    Postpartum Discharge Summary  Date of Service updated     Patient Name: Melody Mcclain DOB: March 12, 1996 MRN: [id number]  Date of admission: 06/21/2024 Delivery date:06/21/2024 Delivering provider: OKEY LEADER Date of discharge: 06/22/2024  Admitting diagnosis: Normal labor [O80, Z37.9] Spontaneous vaginal delivery [O80] Intrauterine pregnancy: [redacted]w[redacted]d     Secondary diagnosis:  Principal Problem:   Spontaneous vaginal delivery Active Problems:   Normal labor  Additional problems: None    Discharge diagnosis: Term Pregnancy Delivered                                              Post partum procedures:None Augmentation: AROM Complications: None  Hospital course: Onset of Labor With Vaginal Delivery      29 y.o. yo [id number] at [redacted]w[redacted]d was admitted in Active Labor on 06/21/2024. Labor course was uncomplicated. Membrane Rupture Time/Date: 4:32 PM,06/21/2024  Delivery Method:Vaginal, Spontaneous Operative Delivery:N/A Episiotomy: None Lacerations:  None Patient had an uncomplicated postpartum course.  She is ambulating, tolerating a regular diet, passing flatus, and urinating well. Patient is discharged home in stable condition on 06/22/24.  Newborn Data: Birth date:06/21/2024 Birth time:4:39 PM Gender:Female Living status:Living Apgars:9 ,10  Weight:3300 g  Magnesium Sulfate received: No BMZ received: No Rhophylac:N/A MMR:N/A T-DaP:Given prenatally Flu: No RSV Vaccine received: No Transfusion:No Immunizations administered: Immunization History  Administered Date(s) Administered   HPV 9-valent 08/04/2018, 09/29/2018, 11/29/2019   Influenza Inj Mdck Quad Pf 03/06/2021   Influenza Inj Mdck Quad With Preservative 03/01/2018   Influenza,inj,quad, With Preservative 03/01/2017   Tdap 06/04/2017, 02/04/2021    Physical exam  Vitals:   06/21/24 1853 06/21/24 2320 06/22/24 0320 06/22/24 0734  BP: 125/81 120/77 115/76 116/79  Pulse: 100 76 83 80  Resp: 18 18 18 17   Temp: 98.6  F (37 C) 98.6 F (37 C) 97.9 F (36.6 C) 98 F (36.7 C)  TempSrc: Oral Oral Oral Oral  SpO2: 99%   100%  Weight:      Height:       General: alert, cooperative, and no distress Lochia: appropriate Uterine Fundus: firm DVT Evaluation: No cords or calf tenderness. No significant calf/ankle edema. Labs: Lab Results  Component Value Date   WBC 17.8 (H) 06/22/2024   HGB 10.6 (L) 06/22/2024   HCT 30.8 (L) 06/22/2024   MCV 93.3 06/22/2024   PLT 169 06/22/2024       No data to display         Edinburgh Score:    06/22/2024   10:21 AM  Edinburgh Postnatal Depression Scale Screening Tool  I have been able to laugh and see the funny side of things. 0  I have looked forward with enjoyment to things. 0  I have blamed myself unnecessarily when things went wrong. 1  I have been anxious or worried for no good reason. 0  I have felt scared or panicky for no good reason. 0  Things have been getting on top of me. 0  I have been so unhappy that I have had difficulty sleeping. 0  I have felt sad or miserable. 0  I have been so unhappy that I have been crying. 0  The thought of harming myself has occurred to me. 0  Edinburgh Postnatal Depression Scale Total 1      After visit meds:  Allergies as of 06/22/2024  Reactions   Gluten Meal    Other Reaction(s): Not available   Epinephrine Palpitations        Medication List     TAKE these medications    acetaminophen  325 MG tablet Commonly known as: Tylenol  Take 2 tablets (650 mg total) by mouth every 6 (six) hours as needed (for pain scale < 4).   ibuprofen  600 MG tablet Commonly known as: ADVIL  Take 1 tablet (600 mg total) by mouth every 6 (six) hours.   polyethylene glycol powder 17 GM/SCOOP powder Commonly known as: MiraLax  Take 17 g by mouth daily as needed for mild constipation. Dissolve 1 capful (17g) in 4-8 ounces of liquid and take by mouth daily.   prenatal multivitamin Tabs tablet Take 1 tablet by  mouth daily at 12 noon.         Discharge home in stable condition Infant Feeding: Breast Infant Disposition:home with mother Discharge instruction: per After Visit Summary and Postpartum booklet. Activity: Advance as tolerated. Pelvic rest for 6 weeks.  Diet: routine diet Anticipated Birth Control: Unsure Postpartum Appointment:6 weeks Additional Postpartum F/U: Postpartum Depression checkup Future Appointments:No future appointments. Follow up Visit:  Follow-up Information     Ob/Gyn, Landy Stains Follow up in 6 week(s).   Contact information: [address] [place] [address] [phone]                     06/22/2024 Larraine DELENA Sharps, DO   "

## 2024-06-22 NOTE — Progress Notes (Addendum)
 Post Partum Day 1 Subjective: Pain minimal. Lochia normal. Breastfeeding well. Desire DC home today if possible  Objective: Blood pressure 116/79, pulse 80, temperature 98 F (36.7 C), temperature source Oral, resp. rate 17, height 5' 4 (1.626 m), weight 70.9 kg, last menstrual period 09/28/2023, SpO2 100%, unknown if currently breastfeeding.  Physical Exam:  General: alert, cooperative, and no distress Lochia: appropriate Uterine Fundus: firm DVT Evaluation: No cords or calf tenderness. No significant calf/ankle edema.  Recent Labs    06/21/24 1431 06/22/24 0128  HGB 11.2* 10.6*  HCT 32.5* 30.8*    Assessment/Plan: 28 yo G3 now P3 PPD#1 s/p NSVD following labor - PP: doing well - Desires circumcision.   Discussed r/b/a of the procedure.  Reviewed that circumcision is an elective surgical procedure and not considered medically necessary.  Reviewed the risks of the procedure including the risk of infection, bleeding, damage to surrounding structures, including scrotum, shaft, urethra and head of penis, and an undesired cosmetic effect requiring additional procedures for revision. - Dispo: DC home later today or tomorrow pending peds   LOS: 1 day   Larraine DELENA Sharps, DO 06/22/2024, 10:02 AM

## 2024-06-22 NOTE — Discharge Instructions (Signed)
 Call office with any concerns [phone]

## 2024-06-30 ENCOUNTER — Telehealth (HOSPITAL_COMMUNITY): Payer: Self-pay | Admitting: *Deleted

## 2024-06-30 NOTE — Telephone Encounter (Signed)
 06/30/2024  Name: PARISSA CHIAO MRN: [id number] DOB: Aug 25, 1995  Reason for Call:  Transition of Care Hospital Discharge Call  Contact Status: Patient Contact Status: Complete  Language assistant needed: Interpreter Mode: Interpreter Not Needed        Follow-Up Questions: Do You Have Any Concerns About Your Health As You Heal From Delivery?: No Do You Have Any Concerns About Your Infants Health?: No  Edinburgh Postnatal Depression Scale:  In the Past 7 Days:    PHQ2-9 Depression Scale:     Discharge Follow-up: Edinburgh score requires follow up?:  (Patient says score today would be 0-1, same or lower than score in hospital which was 1. Patient endorses she is doing well emotionally.) Patient was advised of the following resources:: Support Group, Breastfeeding Support Group  Post-discharge interventions: Reviewed Newborn Safe Sleep Practices  Mliss Sieve, RN 06/20/2024 10:06

## 2024-07-11 ENCOUNTER — Inpatient Hospital Stay (HOSPITAL_COMMUNITY)
Admission: RE | Admit: 2024-07-11 | Payer: PRIVATE HEALTH INSURANCE | Source: Home / Self Care | Admitting: Obstetrics and Gynecology

## 2024-07-11 ENCOUNTER — Inpatient Hospital Stay (HOSPITAL_COMMUNITY): Payer: PRIVATE HEALTH INSURANCE
